# Patient Record
Sex: Female | Born: 1965 | Race: Black or African American | Hispanic: No | Marital: Single | Smoking: Never smoker
Health system: Southern US, Community
[De-identification: ages and names within clinical notes are randomized; demographics above are authoritative.]

## PROBLEM LIST (undated history)

## (undated) DIAGNOSIS — K449 Diaphragmatic hernia without obstruction or gangrene: Secondary | ICD-10-CM

## (undated) DIAGNOSIS — K219 Gastro-esophageal reflux disease without esophagitis: Secondary | ICD-10-CM

## (undated) HISTORY — PX: CHOLECYSTECTOMY: SHX55

## (undated) HISTORY — DX: Diaphragmatic hernia without obstruction or gangrene: K44.9

---

## 2004-08-20 ENCOUNTER — Emergency Department (HOSPITAL_COMMUNITY): Admission: EM | Admit: 2004-08-20 | Discharge: 2004-08-20 | Payer: Self-pay | Admitting: Emergency Medicine

## 2004-09-05 ENCOUNTER — Ambulatory Visit (HOSPITAL_COMMUNITY): Admission: RE | Admit: 2004-09-05 | Discharge: 2004-09-05 | Payer: Self-pay | Admitting: Family Medicine

## 2004-10-03 ENCOUNTER — Encounter (HOSPITAL_COMMUNITY): Admission: RE | Admit: 2004-10-03 | Discharge: 2004-11-02 | Payer: Self-pay | Admitting: Family Medicine

## 2007-08-05 ENCOUNTER — Ambulatory Visit (HOSPITAL_COMMUNITY): Admission: RE | Admit: 2007-08-05 | Discharge: 2007-08-05 | Payer: Self-pay | Admitting: Family Medicine

## 2007-08-21 ENCOUNTER — Encounter (INDEPENDENT_AMBULATORY_CARE_PROVIDER_SITE_OTHER): Payer: Self-pay | Admitting: General Surgery

## 2007-08-21 ENCOUNTER — Ambulatory Visit (HOSPITAL_COMMUNITY): Admission: RE | Admit: 2007-08-21 | Discharge: 2007-08-21 | Payer: Self-pay | Admitting: General Surgery

## 2007-09-25 ENCOUNTER — Emergency Department (HOSPITAL_COMMUNITY): Admission: EM | Admit: 2007-09-25 | Discharge: 2007-09-25 | Payer: Self-pay | Admitting: *Deleted

## 2008-03-16 ENCOUNTER — Emergency Department (HOSPITAL_COMMUNITY): Admission: EM | Admit: 2008-03-16 | Discharge: 2008-03-16 | Payer: Self-pay | Admitting: Emergency Medicine

## 2008-03-31 ENCOUNTER — Ambulatory Visit (HOSPITAL_COMMUNITY): Admission: RE | Admit: 2008-03-31 | Discharge: 2008-03-31 | Payer: Self-pay | Admitting: Internal Medicine

## 2008-10-17 ENCOUNTER — Emergency Department: Payer: Self-pay | Admitting: Emergency Medicine

## 2008-10-23 ENCOUNTER — Emergency Department (HOSPITAL_COMMUNITY): Admission: EM | Admit: 2008-10-23 | Discharge: 2008-10-23 | Payer: Self-pay | Admitting: Emergency Medicine

## 2008-10-26 ENCOUNTER — Ambulatory Visit (HOSPITAL_COMMUNITY): Admission: RE | Admit: 2008-10-26 | Discharge: 2008-10-26 | Payer: Self-pay | Admitting: Family Medicine

## 2009-01-23 ENCOUNTER — Emergency Department (HOSPITAL_COMMUNITY): Admission: EM | Admit: 2009-01-23 | Discharge: 2009-01-23 | Payer: Self-pay | Admitting: Emergency Medicine

## 2009-04-13 ENCOUNTER — Emergency Department (HOSPITAL_COMMUNITY): Admission: EM | Admit: 2009-04-13 | Discharge: 2009-04-13 | Payer: Self-pay | Admitting: Emergency Medicine

## 2009-04-16 ENCOUNTER — Ambulatory Visit (HOSPITAL_COMMUNITY): Admission: RE | Admit: 2009-04-16 | Discharge: 2009-04-16 | Payer: Self-pay | Admitting: Family Medicine

## 2009-04-26 ENCOUNTER — Ambulatory Visit (HOSPITAL_COMMUNITY): Admission: RE | Admit: 2009-04-26 | Discharge: 2009-04-26 | Payer: Self-pay | Admitting: Internal Medicine

## 2009-08-08 ENCOUNTER — Emergency Department (HOSPITAL_COMMUNITY): Admission: EM | Admit: 2009-08-08 | Discharge: 2009-08-08 | Payer: Self-pay | Admitting: Emergency Medicine

## 2009-11-25 ENCOUNTER — Ambulatory Visit (HOSPITAL_COMMUNITY): Admission: RE | Admit: 2009-11-25 | Discharge: 2009-11-25 | Payer: Self-pay | Admitting: Family Medicine

## 2010-03-05 IMAGING — CR DG CHEST 2V
1 series · 2 of 2 positions shown · non-contrast
Comparison: none

REASON FOR EXAM: Palpitations
COMMENTS:   LMP: One week ago

PROCEDURE:     DXR - DXR CHEST PA (OR AP) AND LATERAL  - October 17, 2008  [DATE]
RESULT:     The lungs are clear. The cardiac silhouette and visualized bony
skeleton are unremarkable.

[Series 1: view not recorded · 0.17mm/px · 2 of 2 slices shown]
[im 1/2]
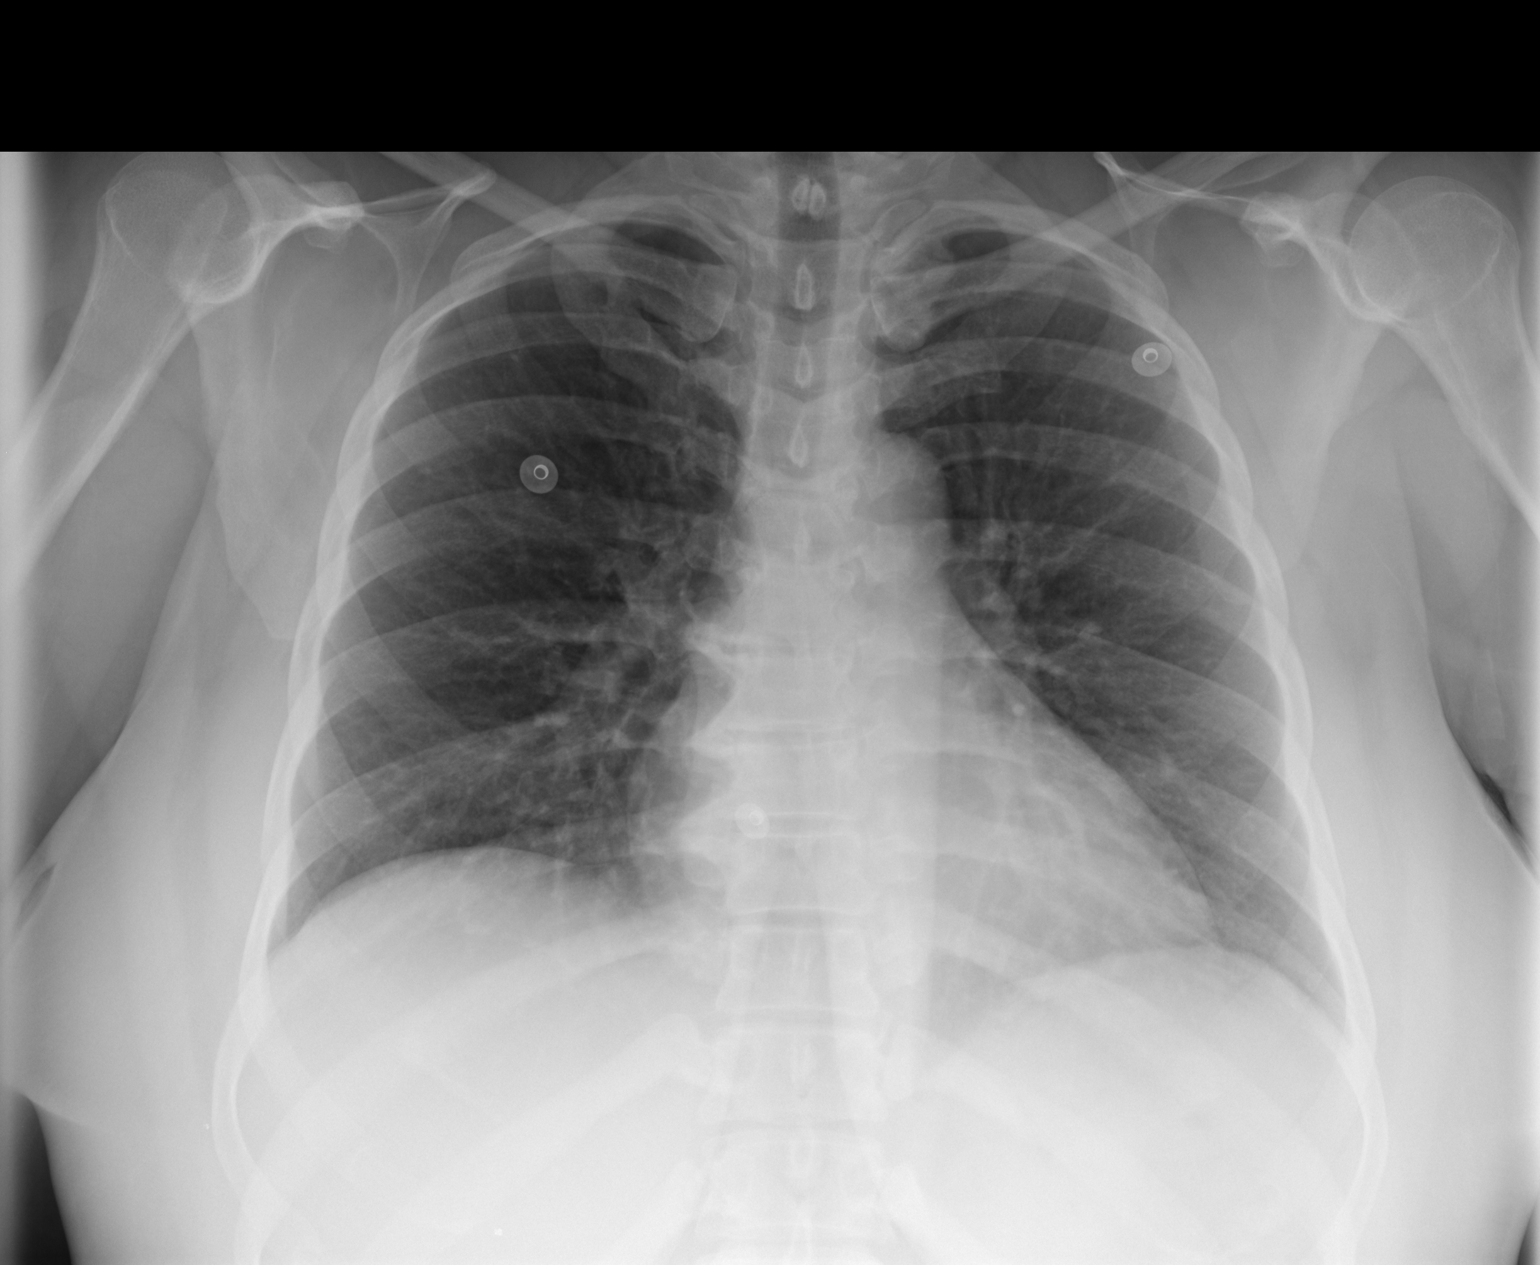
[im 2/2]
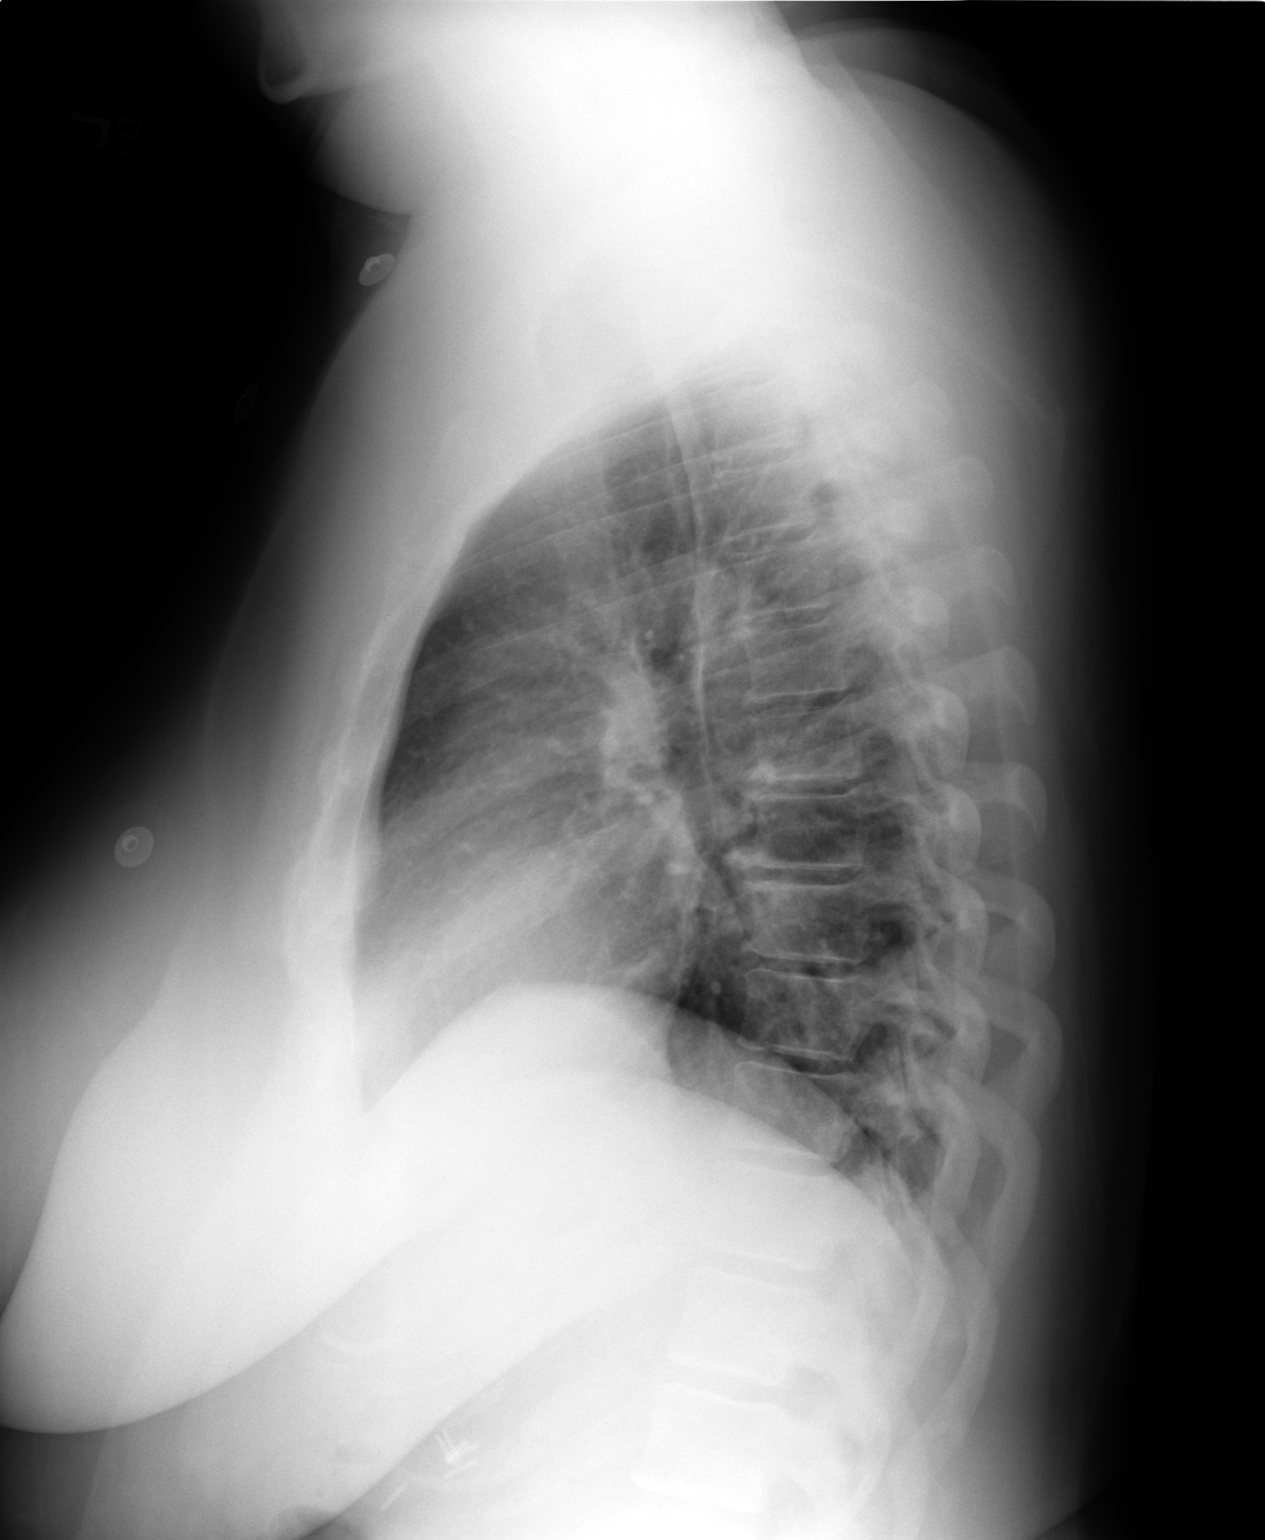

[2 of 2 positions shown; findings below may reference images not displayed]

IMPRESSION: Chest radiograph without evidence of acute cardiopulmonary
disease.

## 2010-06-11 IMAGING — CR DG CHEST 2V
2 series · 2 of 2 positions shown · non-contrast
Comparison: 10/23/2008

CLINICAL DATA: Chest pain

CHEST - 2 VIEW

[view not recorded (1 of 2)]
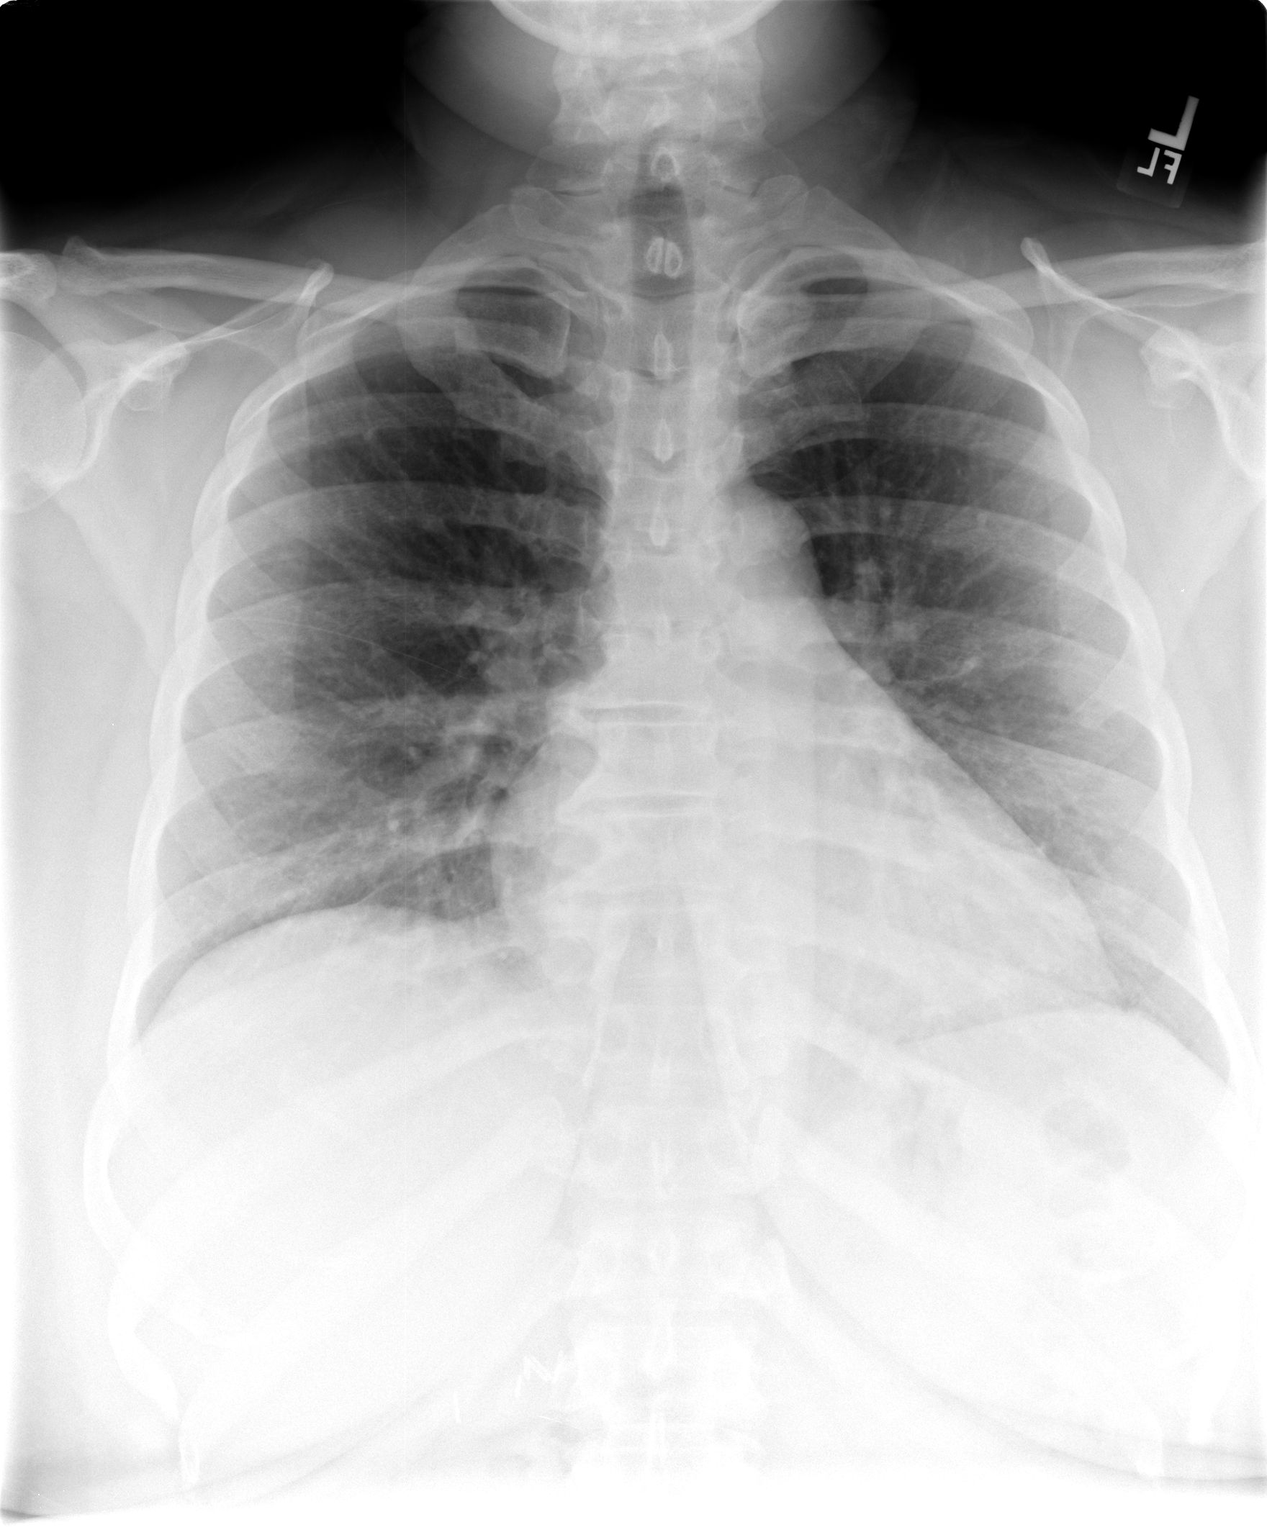

[view not recorded (2 of 2)]
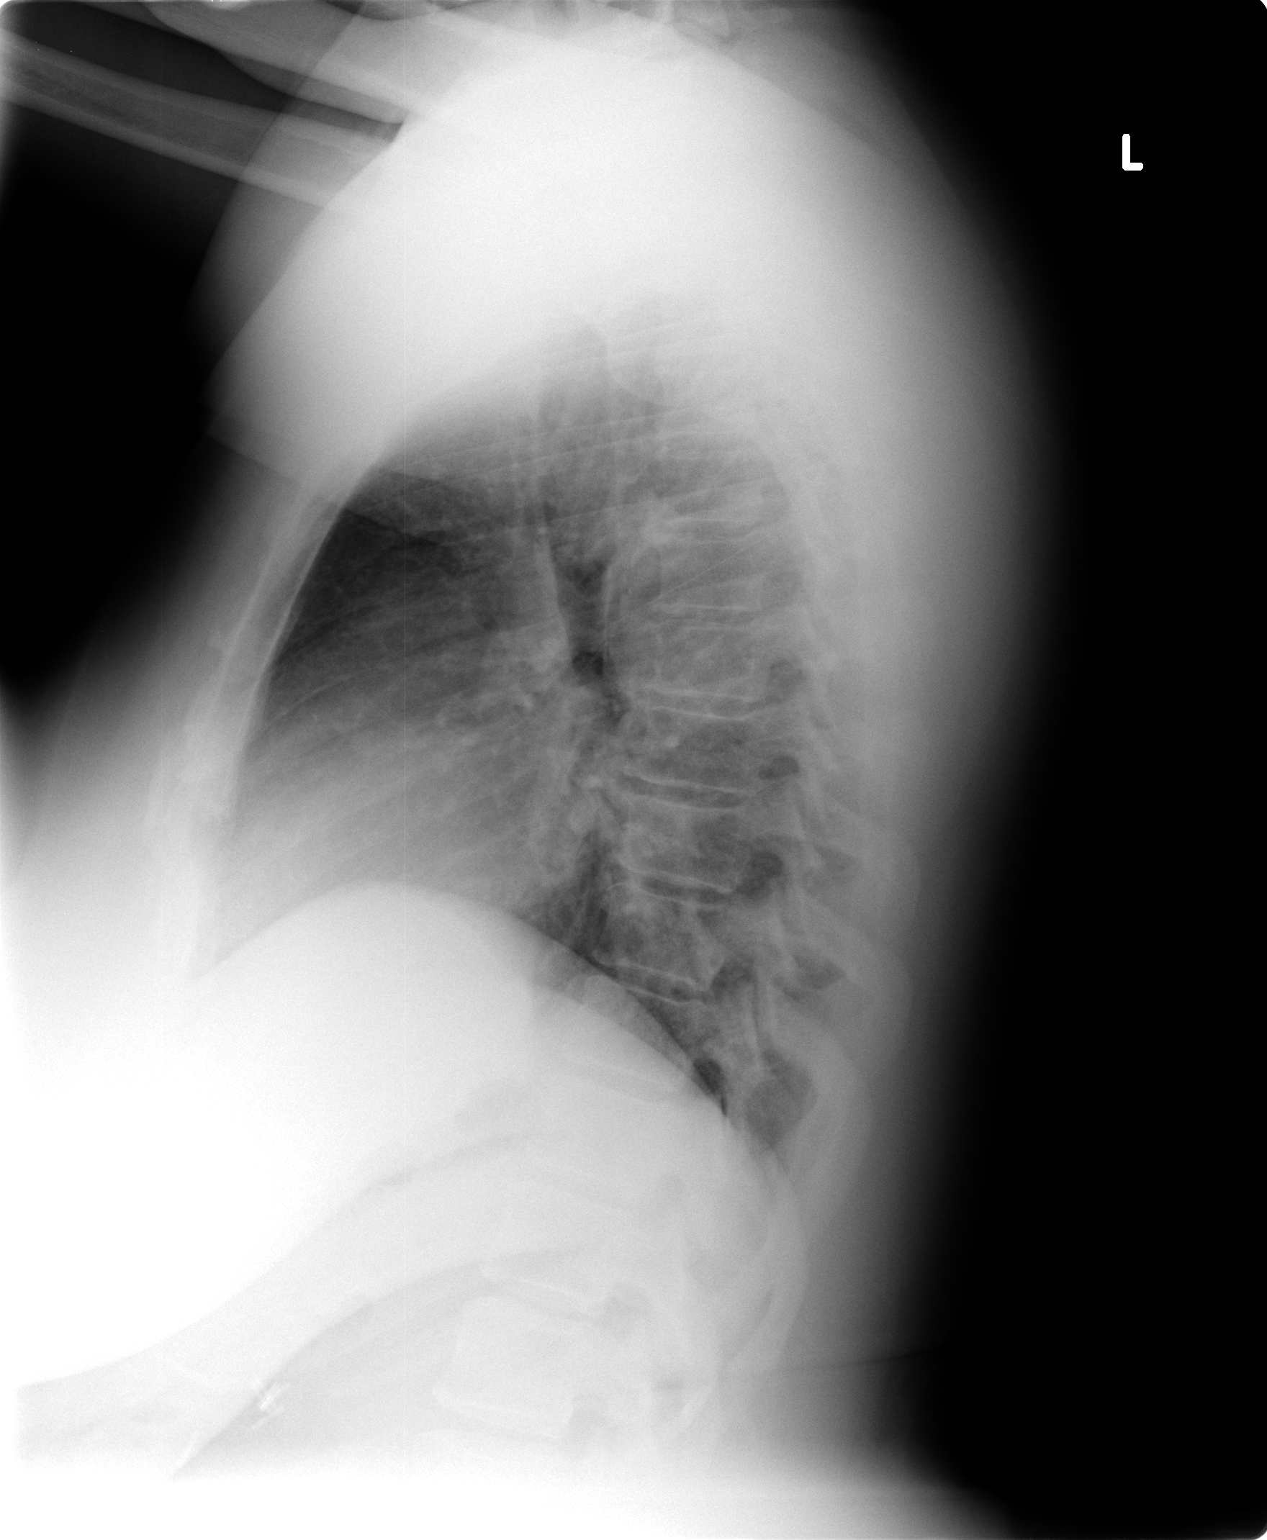

[2 of 2 positions shown; findings below may reference images not displayed]

FINDINGS: Mild cardiomegaly.  Chronic perihilar and bibasilar
interstitial prominence as before without evidence of superimposed
edema or confluent airspace infiltrate.  No effusion.  Vascular
clips in the right upper abdomen.  Spondylitic changes in the mid
thoracic spine.
IMPRESSION: 1.  Stable cardiomegaly and chronic interstitial changes without
acute or superimposed abnormality.

## 2010-11-28 ENCOUNTER — Ambulatory Visit (HOSPITAL_COMMUNITY)
Admission: RE | Admit: 2010-11-28 | Discharge: 2010-11-28 | Payer: Self-pay | Source: Home / Self Care | Attending: Internal Medicine | Admitting: Internal Medicine

## 2010-12-11 ENCOUNTER — Encounter: Payer: Self-pay | Admitting: Family Medicine

## 2011-02-24 LAB — URINALYSIS, ROUTINE W REFLEX MICROSCOPIC
Bilirubin Urine: NEGATIVE
Glucose, UA: NEGATIVE mg/dL
Hgb urine dipstick: NEGATIVE
Ketones, ur: NEGATIVE mg/dL
Nitrite: NEGATIVE
Protein, ur: NEGATIVE mg/dL
Specific Gravity, Urine: <1.005 — ABNORMAL LOW (ref 1.005–1.030)
Urobilinogen, UA: 0.2 mg/dL (ref 0.0–1.0)
pH: 5.5 (ref 5.0–8.0)

## 2011-02-24 LAB — PREGNANCY, URINE: Preg Test, Ur: NEGATIVE

## 2011-02-24 LAB — GLUCOSE, CAPILLARY: Glucose-Capillary: 106 mg/dL — ABNORMAL HIGH (ref 70–99)

## 2011-03-02 LAB — POCT CARDIAC MARKERS
CKMB, poc: <1 ng/mL — ABNORMAL LOW (ref 1.0–8.0)
Myoglobin, poc: 67.8 ng/mL (ref 12–200)
Troponin i, poc: <0.05 ng/mL (ref 0.00–0.09)

## 2011-03-02 LAB — CBC
HCT: 39.5 % (ref 36.0–46.0)
Hemoglobin: 13.2 g/dL (ref 12.0–15.0)
MCHC: 33.4 g/dL (ref 30.0–36.0)
MCV: 87.3 fL (ref 78.0–100.0)
Platelets: 322 10*3/uL (ref 150–400)
RBC: 4.53 MIL/uL (ref 3.87–5.11)
RDW: 12.9 % (ref 11.5–15.5)
WBC: 8.5 10*3/uL (ref 4.0–10.5)

## 2011-03-02 LAB — DIFFERENTIAL
Basophils Absolute: 0.1 10*3/uL (ref 0.0–0.1)
Basophils Relative: 1 % (ref 0–1)
Eosinophils Absolute: 0.1 10*3/uL (ref 0.0–0.7)
Eosinophils Relative: 1 % (ref 0–5)
Lymphocytes Relative: 19 % (ref 12–46)
Lymphs Abs: 1.6 10*3/uL (ref 0.7–4.0)
Monocytes Absolute: 0.7 10*3/uL (ref 0.1–1.0)
Monocytes Relative: 8 % (ref 3–12)
Neutro Abs: 6.1 10*3/uL (ref 1.7–7.7)
Neutrophils Relative %: 71 % (ref 43–77)

## 2011-03-02 LAB — COMPREHENSIVE METABOLIC PANEL
ALT: 22 U/L (ref 0–35)
AST: 22 U/L (ref 0–37)
Albumin: 3.4 g/dL — ABNORMAL LOW (ref 3.5–5.2)
Alkaline Phosphatase: 78 U/L (ref 39–117)
BUN: 9 mg/dL (ref 6–23)
CO2: 27 mEq/L (ref 19–32)
Calcium: 9 mg/dL (ref 8.4–10.5)
Chloride: 101 mEq/L (ref 96–112)
Creatinine, Ser: 0.86 mg/dL (ref 0.4–1.2)
GFR calc Af Amer: >60 mL/min (ref >60)
GFR calc non Af Amer: >60 mL/min (ref >60)
Glucose, Bld: 91 mg/dL (ref 70–99)
Potassium: 3.5 mEq/L (ref 3.5–5.1)
Sodium: 135 mEq/L (ref 135–145)
Total Bilirubin: 0.5 mg/dL (ref 0.3–1.2)
Total Protein: 7 g/dL (ref 6.0–8.3)

## 2011-04-04 NOTE — H&P (Signed)
NAMEFERNE, ELLINGWOOD             ACCOUNT NO.:  [id number]   MEDICAL RECORD NO.:  [id number]          PATIENT TYPE:  AMB   LOCATION:  DAY                           FACILITY:  [facility]   PHYSICIAN:  Dalia Heading, M.D.  DATE OF BIRTH:  1966-04-09   DATE OF ADMISSION:  08/21/2007  DATE OF DISCHARGE:  LH                              HISTORY & PHYSICAL   CHIEF COMPLAINT:  Cholecystitis, cholelithiasis.   HISTORY OF PRESENT ILLNESS:  The patient is a 45 year old black female  who is referred for evaluation and treatment of biliary colic secondary  to cholelithiasis.  She has been having right upper quadrant abdominal  pain, nausea, and bloating for many weeks.  She does have fatty food  intolerance.  No fever, chills, or jaundice have been noted.   PAST MEDICAL HISTORY:  Includes reflux disease.   PAST SURGICAL HISTORY:  Unremarkable.   CURRENT MEDICATIONS:  Hyoscyamine, omeprazole.   ALLERGIES:  AMOXICILLIN.   REVIEW OF SYSTEMS:  The patient denies drinking or smoking.  She denies  any other cardiopulmonary difficulties or bleeding disorders.   PHYSICAL EXAMINATION:  GENERAL:  The patient is a well-developed, well-  nourished black female in no acute distress.  HEENT:  Reveals no scleral icterus.  LUNGS:  Clear to auscultation with equal breath sounds bilaterally.  HEART:  Reveals a regular rate and rhythm without S3, S4, or murmurs.  ABDOMEN:  Soft and nondistended.  She is tender in the right upper  quadrant to palpation.  No hepatosplenomegaly, masses, or hernias are  identified.  Ultrasound of the gallbladder reveals cholelithiasis.   IMPRESSION:  Cholecystitis, cholelithiasis.   PLAN:  The patient is scheduled for laparoscopic cholecystectomy on  August 21, 2007.  The risks and benefits of the procedure including  bleeding, infection, hepatobiliary injury, the possibility of an open  procedure were fully explained to the patient, gave informed consent.      Dalia Heading, M.D.  Electronically Signed     MAJ/MEDQ  D:  08/15/2007  T:  08/16/2007  Job:  [id number]   cc:   Dalia Heading, M.D.  Fax: [phone]   Melony Overly, PA

## 2011-04-04 NOTE — Op Note (Signed)
NAMESHEILY, LINEMAN             ACCOUNT NO.:  [id number]   MEDICAL RECORD NO.:  [id number]          PATIENT TYPE:  AMB   LOCATION:  DAY                           FACILITY:  [facility]   PHYSICIAN:  Dalia Heading, M.D.  DATE OF BIRTH:  08-Jun-1966   DATE OF PROCEDURE:  08/21/2007  DATE OF DISCHARGE:                               OPERATIVE REPORT   PREOPERATIVE DIAGNOSES:  1. Cholecystitis.  2. Cholelithiasis.   POSTOPERATIVE DIAGNOSES:  1. Cholecystitis.  2. Cholelithiasis.  3. Choledocholithiasis.   PROCEDURE:  Laparoscopic cholecystectomy with intraoperative  cholangiograms.   SURGEON:  Dalia Heading, MD   ASSISTANT:  Tilford Pillar, MD   ANESTHESIA:  General endotracheal.   INDICATIONS:  The patient is a 45 year old black female who is referred  for evaluation and treatment biliary colic secondary to cholelithiasis.  The risks and benefits of the procedure including bleeding, infection,  hepatobiliary injury, and the possibility of an open procedure, were  fully explained to the patient, who gave informed consent.   PROCEDURE NOTE:  The patient was placed in the supine position.  After  induction of general endotracheal anesthesia, the abdomen was prepped  and draped using the usual sterile technique with Betadine.  Surgical  site confirmation was performed.   An infraumbilical incision was made down to the fascia.  A Veress needle  was introduced into the abdominal cavity and confirmation of placement  was done using the saline drop test.  The abdomen was then insufflated  to 16 mmHg pressure.  A 11-mm trocar was introduced into the abdominal  cavity under direct visualization without difficulty.  The patient was  placed in reversed Trendelenburg position.  An additional 11-mm trocar  was placed in the epigastric region and 5-mm trocars were placed in the  right upper quadrant and right flank regions.  The liver was inspected  and noted to normal limits.  The  gallbladder was retracted superior and  laterally.  The dissection was begun around the infundibulum of the  gallbladder.  The cystic duct was first identified.  Its juncture to the  infundibulum fully identified.  A single Endoclip was placed proximally  on the cystic duct.  An incision was made into the cystic duct and a  cholangiocatheter was inserted.  Under digital fluoroscopy,  cholangiograms were performed.  Two filling defects were noted in the  lower portion of the common bile duct.  The hepatobiliary tree was then  flushed aggressively with normal saline.  Repeat cholangiograms showed  the filling defects to be gone.  The dye flowed freely into the  duodenum.  I suspect that the patient did have two common bile duct  stones which were flushed into the GI tract without difficulty.  Multiple Endoclips were placed distally on the cystic duct and the  cystic duct was divided.  The cystic artery was likewise ligated and  divided.  The gallbladder was freed away from the gallbladder fossa  using Bovie electrocautery.  The gallbladder was delivered through the  epigastric trocar site using an EndoCatch bag.  The gallbladder fossa  was inspected and  no abnormal bleeding or bile leakage was noted.  Surgicel was placed in the gallbladder fossa.  All fluid and air were  then evacuated from the abdominal cavity prior to removal of the  trocars.   All wounds were irrigated with normal saline.  All wounds were injected  with 0.5% Sensorcaine.  The infraumbilical fascia was reapproximated  using a 0 Vicryl interrupted suture.  All skin incisions were closed  using staples.  Betadine ointment and dry sterile dressings were  applied.   All tape and needle counts were correct at the end of the procedure.  The patient was extubated in the operating room and went back to  recovery room awake in stable condition.   COMPLICATIONS:  None.   SPECIMEN:  Gallbladder.   BLOOD LOSS:   Minimal.      Dalia Heading, M.D.  Electronically Signed     MAJ/MEDQ  D:  08/21/2007  T:  08/21/2007  Job:  [id number]   cc:   [facility]

## 2011-08-25 LAB — URINE MICROSCOPIC-ADD ON

## 2011-08-25 LAB — PREGNANCY, URINE: Preg Test, Ur: NEGATIVE

## 2011-08-25 LAB — URINALYSIS, ROUTINE W REFLEX MICROSCOPIC
Ketones, ur: NEGATIVE mg/dL
Nitrite: NEGATIVE
Specific Gravity, Urine: 1.015 (ref 1.005–1.030)
Urobilinogen, UA: 0.2 mg/dL (ref 0.0–1.0)
pH: 6 (ref 5.0–8.0)

## 2011-08-25 LAB — POCT I-STAT, CHEM 8
Chloride: 105 mEq/L (ref 96–112)
Creatinine, Ser: 1 mg/dL (ref 0.4–1.2)
Hemoglobin: 13.9 g/dL (ref 12.0–15.0)
TCO2: 27 mmol/L (ref 0–100)

## 2011-08-29 LAB — COMPREHENSIVE METABOLIC PANEL
ALT: 23
AST: 27
CO2: 24
Calcium: 9.3
GFR calc Af Amer: >60
Potassium: 4
Sodium: 139
Total Protein: 7.7

## 2011-08-31 LAB — BASIC METABOLIC PANEL
BUN: 7
Chloride: 103
Glucose, Bld: 87
Potassium: 3.6

## 2011-08-31 LAB — CBC
HCT: 39.1
MCHC: 32.9
MCV: 84.5
Platelets: 379
RDW: 13.4

## 2011-08-31 LAB — HEPATIC FUNCTION PANEL
Bilirubin, Direct: 0.1
Total Bilirubin: 0.6

## 2011-10-24 ENCOUNTER — Other Ambulatory Visit (HOSPITAL_COMMUNITY): Payer: Self-pay | Admitting: Internal Medicine

## 2011-10-24 DIAGNOSIS — Z139 Encounter for screening, unspecified: Secondary | ICD-10-CM

## 2011-12-04 ENCOUNTER — Ambulatory Visit (HOSPITAL_COMMUNITY)
Admission: RE | Admit: 2011-12-04 | Discharge: 2011-12-04 | Disposition: A | Discharge status: Home / Self Care | Payer: Self-pay | Source: Ambulatory Visit | Attending: Internal Medicine | Admitting: Internal Medicine

## 2011-12-04 DIAGNOSIS — Z1231 Encounter for screening mammogram for malignant neoplasm of breast: Secondary | ICD-10-CM | POA: Insufficient documentation

## 2011-12-04 DIAGNOSIS — Z139 Encounter for screening, unspecified: Secondary | ICD-10-CM

## 2013-12-16 ENCOUNTER — Other Ambulatory Visit (HOSPITAL_COMMUNITY): Payer: Self-pay | Admitting: Internal Medicine

## 2013-12-16 DIAGNOSIS — Z139 Encounter for screening, unspecified: Secondary | ICD-10-CM

## 2013-12-22 ENCOUNTER — Ambulatory Visit (HOSPITAL_COMMUNITY)
Admission: RE | Admit: 2013-12-22 | Discharge: 2013-12-22 | Disposition: A | Discharge status: Home / Self Care | Payer: PRIVATE HEALTH INSURANCE | Source: Ambulatory Visit | Attending: Internal Medicine | Admitting: Internal Medicine

## 2013-12-22 DIAGNOSIS — Z139 Encounter for screening, unspecified: Secondary | ICD-10-CM

## 2013-12-22 DIAGNOSIS — Z1231 Encounter for screening mammogram for malignant neoplasm of breast: Secondary | ICD-10-CM | POA: Insufficient documentation

## 2014-12-29 ENCOUNTER — Other Ambulatory Visit (HOSPITAL_COMMUNITY): Payer: Self-pay | Admitting: Internal Medicine

## 2014-12-29 ENCOUNTER — Ambulatory Visit (HOSPITAL_COMMUNITY): Payer: PRIVATE HEALTH INSURANCE

## 2014-12-29 DIAGNOSIS — Z1231 Encounter for screening mammogram for malignant neoplasm of breast: Secondary | ICD-10-CM

## 2015-01-04 ENCOUNTER — Ambulatory Visit (HOSPITAL_COMMUNITY): Payer: PRIVATE HEALTH INSURANCE

## 2015-01-11 ENCOUNTER — Ambulatory Visit (HOSPITAL_COMMUNITY)
Admission: RE | Admit: 2015-01-11 | Discharge: 2015-01-11 | Disposition: A | Discharge status: Home / Self Care | Payer: PRIVATE HEALTH INSURANCE | Source: Ambulatory Visit | Attending: Internal Medicine | Admitting: Internal Medicine

## 2015-01-11 DIAGNOSIS — Z1231 Encounter for screening mammogram for malignant neoplasm of breast: Secondary | ICD-10-CM | POA: Diagnosis present

## 2015-12-13 ENCOUNTER — Other Ambulatory Visit (HOSPITAL_COMMUNITY): Payer: Self-pay | Admitting: Family Medicine

## 2015-12-13 DIAGNOSIS — Z1231 Encounter for screening mammogram for malignant neoplasm of breast: Secondary | ICD-10-CM

## 2016-01-17 ENCOUNTER — Ambulatory Visit (HOSPITAL_COMMUNITY)
Admission: RE | Admit: 2016-01-17 | Discharge: 2016-01-17 | Disposition: A | Discharge status: Home / Self Care | Payer: Managed Care, Other (non HMO) | Source: Ambulatory Visit | Attending: Family Medicine | Admitting: Family Medicine

## 2016-01-17 DIAGNOSIS — Z1231 Encounter for screening mammogram for malignant neoplasm of breast: Secondary | ICD-10-CM | POA: Insufficient documentation

## 2016-05-10 ENCOUNTER — Encounter: Payer: Self-pay | Admitting: *Deleted

## 2016-05-11 ENCOUNTER — Ambulatory Visit (INDEPENDENT_AMBULATORY_CARE_PROVIDER_SITE_OTHER): Payer: PRIVATE HEALTH INSURANCE | Admitting: Obstetrics and Gynecology

## 2016-05-11 ENCOUNTER — Other Ambulatory Visit: Payer: Self-pay | Admitting: Obstetrics and Gynecology

## 2016-05-11 ENCOUNTER — Encounter: Payer: Self-pay | Admitting: Obstetrics and Gynecology

## 2016-05-11 VITALS — BP 150/92 | Ht 60.5 in | Wt 244.0 lb

## 2016-05-11 DIAGNOSIS — N841 Polyp of cervix uteri: Secondary | ICD-10-CM | POA: Diagnosis not present

## 2016-05-11 NOTE — Progress Notes (Signed)
Patient ID: Samantha Walsh, female   DOB: 09-13-1966, 50 y.o.   MRN: [id number]    [place] [facility] Visit  @DATE @            Patient name: Samantha Walsh MRN [id number]  Date of birth: 10-13-1966  CC & HPI:  Samantha Walsh is a 50 y.o. female presenting today for cervical tag removal. Pt was referred by her PCP at [facility]. Per [facility] records, tag protruding from cervix has increased in size from last year and pt was referred here for f/u d/t h/o benign cervical polyp I. Pt states her periods are regular on oral contraceptives. She denies vaginal bleeding or abdominal pain.   ROS:  Review of Systems  Gastrointestinal: Negative for abdominal pain.  Genitourinary:       -vaginal bleeding  All other systems reviewed and are negative.    Pertinent History Reviewed:   Reviewed: Significant for benign cervical polyp I  Medical         Past Medical History  Diagnosis Date  . Hiatal hernia                               Surgical Hx:    Past Surgical History  Procedure Laterality Date  . Cholecystectomy     Medications: Reviewed & Updated - see associated section                       Current outpatient prescriptions:  .  cetirizine (ZYRTEC) 10 MG tablet, Take 10 mg by mouth daily., Disp: , Rfl:  .  hyoscyamine (ANASPAZ) 0.125 MG TBDP disintergrating tablet, Place 0.125 mg under the tongue 4 (four) times daily as needed., Disp: , Rfl:  .  norethindrone (MICRONOR,CAMILA,ERRIN) 0.35 MG tablet, Take 1 tablet by mouth daily., Disp: , Rfl:  .  omeprazole (PRILOSEC OTC) 20 MG tablet, Take 20 mg by mouth daily., Disp: , Rfl:    Social History: Reviewed -  reports that she has never smoked. She has never used smokeless tobacco.  Objective Findings:  Vitals: Blood pressure 150/92, height 5' 0.5" (1.537 m), weight 244 lb (110.678 kg).  Physical Examination: General appearance - alert, well appearing, and in no distress Abdomen - soft, nontender, nondistended, no masses or  organomegaly Pelvic - normal external genitalia, vulva, vagina, cervix, uterus and adnexa,  VULVA: normal appearing vulva with no masses, tenderness or lesions,  VAGINA: normal appearing vagina with normal color and discharge, no lesions,  CERVIX: normal appearing cervix without discharge or lesions,except for polyp flat on surface, 1 cm wide x 5 mm thick UTERUS: small, non-tender  Musculoskeletal - no joint tenderness, deformity or swelling Extremities - peripheral pulses normal, no pedal edema, no clubbing or cyanosis Skin - normal coloration and turgor, no rashes, no suspicious skin lesions noted  Removal of Cervical Polyp Procedure Note  The patient's identification was confirmed and consent was obtained. This procedure was performed by Jonnie Kind at 4:25 PM Site & Appearance: cervix  Sterile procedures observed Anesthetic used: none Monsel's applied  1 cmCervical  tag excised grasped with uterine packing forcepsand sample sent to pathology.  Patient tolerated procedure well without complications. Minimal blood loss. Instructions for care discussed verbally and patient provided with menstrual pad, treated with monsels. additional written instructions for homecare and f/u.    Assessment & Plan:   A:  1. Encounter for evaluation and  removal of cervical polyp   P:  1. F/u PRN 2. Send results to Dara Hoyer PA      By signing my name below, I, Hansel Feinstein, attest that this documentation has been prepared under the direction and in the presence of Jonnie Kind, MD. Electronically Signed: Hansel Feinstein, ED Scribe. 05/11/2016. 4:18 PM.  . I personally performed the services described in this documentation, which was SCRIBED in my presence. The recorded information has been reviewed and considered accurate. It has been edited as necessary during review. Jonnie Kind, MD

## 2016-05-11 NOTE — Addendum Note (Signed)
Addended by: Farley Ly on: 05/11/2016 05:12 PM   Modules accepted: Orders

## 2016-05-16 ENCOUNTER — Encounter: Payer: Self-pay | Admitting: Adult Health

## 2016-05-16 ENCOUNTER — Telehealth: Payer: Self-pay | Admitting: Adult Health

## 2016-05-16 NOTE — Telephone Encounter (Signed)
Spoke with pt. Pt was calling for her polyp results. We have no results so I called [facility] and they stated they just got the biopsy last pm and it should be completed today or tomorrow. I told pt to call back on Thursday if she hasn't heard from [place]. Pt voiced understanding. [place]

## 2016-05-24 ENCOUNTER — Telehealth: Payer: Self-pay | Admitting: *Deleted

## 2016-05-24 NOTE — Telephone Encounter (Signed)
Pt aware of results of polyp.

## 2016-12-20 ENCOUNTER — Other Ambulatory Visit (HOSPITAL_COMMUNITY): Payer: Self-pay | Admitting: Family Medicine

## 2016-12-20 DIAGNOSIS — Z1231 Encounter for screening mammogram for malignant neoplasm of breast: Secondary | ICD-10-CM

## 2017-01-22 ENCOUNTER — Ambulatory Visit (HOSPITAL_COMMUNITY)
Admission: RE | Admit: 2017-01-22 | Discharge: 2017-01-22 | Disposition: A | Discharge status: Home / Self Care | Payer: PRIVATE HEALTH INSURANCE | Source: Ambulatory Visit | Attending: Family Medicine | Admitting: Family Medicine

## 2017-01-22 DIAGNOSIS — Z1231 Encounter for screening mammogram for malignant neoplasm of breast: Secondary | ICD-10-CM | POA: Diagnosis not present

## 2017-05-24 ENCOUNTER — Telehealth: Payer: Self-pay

## 2017-05-24 NOTE — Telephone Encounter (Signed)
[phone] PATIENT RECEIVED LETTER TO SCHEDULE TCS

## 2017-05-30 ENCOUNTER — Telehealth: Payer: Self-pay

## 2017-05-30 NOTE — Telephone Encounter (Signed)
Tried to call. Could not leave a VM.

## 2017-05-30 NOTE — Telephone Encounter (Signed)
See separate triage.  

## 2017-06-13 NOTE — Telephone Encounter (Signed)
Waiting on call back to see if pt has every had any complications with anesthesia.

## 2017-06-13 NOTE — Telephone Encounter (Signed)
Waiting on call to see if pt has every had any complications with anesthesia to complete the traige.

## 2017-06-21 NOTE — Telephone Encounter (Signed)
Gastroenterology Pre-Procedure Review  Request Date:05/30/2017 Requesting Physician: Delman Cheadle, PA  PATIENT REVIEW QUESTIONS: The patient responded to the following health history questions as indicated:    1. Diabetes Melitis: no 2. Joint replacements in the past 12 months: no 3. Major health problems in the past 3 months: no 4. Has an artificial valve or MVP: no 5. Has a defibrillator: no 6. Has been advised in past to take antibiotics in advance of a procedure like teeth cleaning: no 7. Family history of colon cancer: no  8. Alcohol Use: no 9. History of sleep apnea: no  10. History of coronary artery or other vascular stents placed within the last 12 months: no 11. History of any prior anesthesia complications: no    MEDICATIONS & ALLERGIES:    Patient reports the following regarding taking any blood thinners:   Plavix? no Aspirin? no Coumadin? no Brilinta? no Xarelto? no Eliquis? no Pradaxa? no Savaysa? no Effient? no  Patient confirms/reports the following medications:  Current Outpatient Prescriptions  Medication Sig Dispense Refill  . cetirizine (ZYRTEC) 10 MG tablet Take 10 mg by mouth daily. Takes only as needed    . hyoscyamine (ANASPAZ) 0.125 MG TBDP disintergrating tablet Place 0.125 mg under the tongue 4 (four) times daily as needed.    . norethindrone (MICRONOR,CAMILA,ERRIN) 0.35 MG tablet Take 1 tablet by mouth daily.    Marland Kitchen omeprazole (PRILOSEC OTC) 20 MG tablet Take 20 mg by mouth daily.     No current facility-administered medications for this visit.     Patient confirms/reports the following allergies:  Allergies  Allergen Reactions  . Amoxicillin   . Clindamycin/Lincomycin     oral    No orders of the defined types were placed in this encounter.   AUTHORIZATION INFORMATION Primary Insurance:   ID #:   Group #:  Pre-Cert / Auth required:  Pre-Cert / Auth #:   Secondary Insurance:  ID #:   Group #:  Pre-Cert / Auth required:  Pre-Cert  / Auth #:   SCHEDULE INFORMATION: Procedure has been scheduled as follows:  Date:           Time:   Location:   This Gastroenterology Pre-Precedure Review Form is being routed to the following provider(s): Barney Drain, MD

## 2017-06-21 NOTE — Telephone Encounter (Signed)
Ok to schedule.

## 2017-06-26 ENCOUNTER — Other Ambulatory Visit: Payer: Self-pay

## 2017-06-26 DIAGNOSIS — Z1211 Encounter for screening for malignant neoplasm of colon: Secondary | ICD-10-CM

## 2017-06-26 MED ORDER — PEG 3350-KCL-NA BICARB-NACL 420 G PO SOLR: 4000.0000 mL | ORAL | 0 refills | Status: DC

## 2017-06-26 NOTE — Telephone Encounter (Signed)
Pt has been scheduled for colonoscopy on 08/13/2017 at 10:30 Am with Dr. Oneida Alar.

## 2017-06-26 NOTE — Telephone Encounter (Signed)
Rx sent to the pharmacy and instructions mailed to pt.  

## 2017-06-26 NOTE — Telephone Encounter (Signed)
LMOM for a return call for date and time for procedure.

## 2017-06-27 NOTE — Telephone Encounter (Signed)
NO PA is needed for TCS 

## 2017-07-02 NOTE — Telephone Encounter (Signed)
Pt called to make sure we are aware that she has a hiatal hernia. She is scheduled for her colonoscopy on 08/13/2017.  I told her I would let the staff know but that should not interfere with her procedure.

## 2017-07-04 NOTE — Telephone Encounter (Signed)
Agree, hiatal hernia should not be an issue for colonoscopy. Thanks.

## 2017-07-05 NOTE — Telephone Encounter (Signed)
Left the message on VM.

## 2017-08-06 ENCOUNTER — Telehealth: Payer: Self-pay | Admitting: Gastroenterology

## 2017-08-06 NOTE — Telephone Encounter (Signed)
Pt said her insurance co said that we need to bill her colonoscopy as preventative care. I explained to her that we list it as screening which is same as preventative. She said they want [place] to call and speak with them. I told her that I would give her the code and let her call them and I gave her the [place] code [place] 40347. She said she will call the insurance co.

## 2017-08-06 NOTE — Telephone Encounter (Signed)
Patient called to speak with DS. I told her that DS was at lunch and I would have to take a message. She has questions about her colonoscopy with SF on 9/24 and would like DS to call her at [phone]

## 2017-08-06 NOTE — Telephone Encounter (Signed)
Reviewed

## 2017-08-13 ENCOUNTER — Encounter (HOSPITAL_COMMUNITY): Payer: Self-pay

## 2017-08-13 ENCOUNTER — Ambulatory Visit (HOSPITAL_COMMUNITY)
Admission: RE | Admit: 2017-08-13 | Discharge: 2017-08-13 | Disposition: A | Discharge status: Home / Self Care | Payer: PRIVATE HEALTH INSURANCE | Source: Ambulatory Visit | Attending: Gastroenterology | Admitting: Gastroenterology

## 2017-08-13 ENCOUNTER — Encounter (HOSPITAL_COMMUNITY)
Admission: RE | Disposition: A | Discharge status: Home / Self Care | Payer: Self-pay | Source: Ambulatory Visit | Attending: Gastroenterology

## 2017-08-13 DIAGNOSIS — D125 Benign neoplasm of sigmoid colon: Secondary | ICD-10-CM | POA: Diagnosis not present

## 2017-08-13 DIAGNOSIS — K621 Rectal polyp: Secondary | ICD-10-CM | POA: Diagnosis not present

## 2017-08-13 DIAGNOSIS — K644 Residual hemorrhoidal skin tags: Secondary | ICD-10-CM | POA: Diagnosis not present

## 2017-08-13 DIAGNOSIS — Z881 Allergy status to other antibiotic agents status: Secondary | ICD-10-CM | POA: Diagnosis not present

## 2017-08-13 DIAGNOSIS — Z1212 Encounter for screening for malignant neoplasm of rectum: Secondary | ICD-10-CM | POA: Diagnosis not present

## 2017-08-13 DIAGNOSIS — Z79899 Other long term (current) drug therapy: Secondary | ICD-10-CM | POA: Insufficient documentation

## 2017-08-13 DIAGNOSIS — K449 Diaphragmatic hernia without obstruction or gangrene: Secondary | ICD-10-CM | POA: Diagnosis not present

## 2017-08-13 DIAGNOSIS — K635 Polyp of colon: Secondary | ICD-10-CM

## 2017-08-13 DIAGNOSIS — K648 Other hemorrhoids: Secondary | ICD-10-CM | POA: Insufficient documentation

## 2017-08-13 DIAGNOSIS — D126 Benign neoplasm of colon, unspecified: Secondary | ICD-10-CM | POA: Diagnosis not present

## 2017-08-13 DIAGNOSIS — Z1211 Encounter for screening for malignant neoplasm of colon: Secondary | ICD-10-CM | POA: Diagnosis present

## 2017-08-13 DIAGNOSIS — K219 Gastro-esophageal reflux disease without esophagitis: Secondary | ICD-10-CM | POA: Diagnosis not present

## 2017-08-13 DIAGNOSIS — Z8249 Family history of ischemic heart disease and other diseases of the circulatory system: Secondary | ICD-10-CM | POA: Insufficient documentation

## 2017-08-13 DIAGNOSIS — Z880 Allergy status to penicillin: Secondary | ICD-10-CM | POA: Diagnosis not present

## 2017-08-13 DIAGNOSIS — Z833 Family history of diabetes mellitus: Secondary | ICD-10-CM | POA: Insufficient documentation

## 2017-08-13 DIAGNOSIS — Q438 Other specified congenital malformations of intestine: Secondary | ICD-10-CM | POA: Diagnosis not present

## 2017-08-13 DIAGNOSIS — Z9049 Acquired absence of other specified parts of digestive tract: Secondary | ICD-10-CM | POA: Insufficient documentation

## 2017-08-13 HISTORY — DX: Gastro-esophageal reflux disease without esophagitis: K21.9

## 2017-08-13 HISTORY — PX: COLONOSCOPY: SHX5424

## 2017-08-13 HISTORY — PX: POLYPECTOMY: SHX5525

## 2017-08-13 SURGERY — COLONOSCOPY
Anesthesia: Moderate Sedation

## 2017-08-13 MED ORDER — MIDAZOLAM HCL 5 MG/5ML IJ SOLN
INTRAMUSCULAR | Status: AC
  Filled 2017-08-13: qty 10

## 2017-08-13 MED ORDER — MEPERIDINE HCL 100 MG/ML IJ SOLN
INTRAMUSCULAR | Status: DC | PRN
  Administered 2017-08-13 (×3): 25 mg via INTRAVENOUS

## 2017-08-13 MED ORDER — STERILE WATER FOR IRRIGATION IR SOLN
Status: DC | PRN
  Administered 2017-08-13: 10:00:00

## 2017-08-13 MED ORDER — SODIUM CHLORIDE 0.9 % IV SOLN
INTRAVENOUS | Status: DC
  Administered 2017-08-13: 09:00:00 via INTRAVENOUS

## 2017-08-13 MED ORDER — MIDAZOLAM HCL 5 MG/5ML IJ SOLN
INTRAMUSCULAR | Status: DC | PRN
  Administered 2017-08-13 (×4): 2 mg via INTRAVENOUS

## 2017-08-13 MED ORDER — MEPERIDINE HCL 100 MG/ML IJ SOLN
INTRAMUSCULAR | Status: AC
  Filled 2017-08-13: qty 2

## 2017-08-13 NOTE — H&P (Signed)
  Primary Care Physician:  Scherrie Bateman Primary Gastroenterologist:  Dr. Oneida Alar  Pre-Procedure History & Physical: HPI:  Samantha Walsh is a 51 y.o. female here for [place].  Past Medical History:  Diagnosis Date  . GERD (gastroesophageal reflux disease)   . Hiatal hernia     Past Surgical History:  Procedure Laterality Date  . CHOLECYSTECTOMY      Prior to Admission medications   Medication Sig Start Date End Date Taking? Authorizing Provider  cetirizine (ZYRTEC) 10 MG tablet Take 10 mg by mouth daily as needed for allergies. Takes only as needed   Yes [provider]  hyoscyamine (ANASPAZ) 0.125 MG TBDP disintergrating tablet Place 0.125 mg under the tongue 4 (four) times daily as needed for cramping.    Yes [provider]  ibuprofen (ADVIL,MOTRIN) 200 MG tablet Take 400 mg by mouth every 8 (eight) hours as needed for mild pain or moderate pain.   Yes [provider]  norethindrone (MICRONOR,CAMILA,ERRIN) 0.35 MG tablet Take 1 tablet by mouth daily.   Yes [provider]  omeprazole (PRILOSEC OTC) 20 MG tablet Take 20 mg by mouth daily.   Yes [provider]    Allergies as of 06/26/2017 - Review Complete 05/30/2017  Allergen Reaction Noted  . Amoxicillin  05/10/2016  . Clindamycin/lincomycin  05/10/2016    Family History  Problem Relation Age of Onset  . Diabetes Mother   . Hypertension Mother   . Colon cancer Neg Hx   . Colon polyps Neg Hx     Social History   Social History  . Marital status: Single    Spouse name: N/A  . Number of children: N/A  . Years of education: N/A   Occupational History  . Not on file.   Social History Main Topics  . Smoking status: Never Smoker  . Smokeless tobacco: Never Used  . Alcohol use No  . Drug use: No  . Sexual activity: Not Currently    Birth control/ protection: Pill   Other Topics Concern  . Not on file   Social History Narrative  . No  narrative on file    Review of Systems: See HPI, otherwise negative ROS   Physical Exam: BP (!) 160/91   Pulse 74   Temp 99.4 F (37.4 C) (Oral)   Resp 16   Ht 5' (1.524 m)   Wt 244 lb (110.7 kg)   SpO2 98%   BMI 47.65 kg/m  General:   Alert,  pleasant and cooperative in NAD Head:  Normocephalic and atraumatic. Neck:  Supple; Lungs:  Clear throughout to auscultation.    Heart:  Regular rate and rhythm. Abdomen:  Soft, nontender and nondistended. Normal bowel sounds, without guarding, and without rebound.   Neurologic:  Alert and  oriented x4;  grossly normal neurologically.  Impression/Plan:     SCREENING  Plan:  1. TCS TODAY DISCUSSED PROCEDURE, BENEFITS, & RISKS: < 1% chance of medication reaction, bleeding, perforation, or rupture of spleen/liver.

## 2017-08-13 NOTE — Op Note (Signed)
[facility] Patient Name: Samantha Walsh Procedure Date: 08/13/2017 10:04 AM MRN: [id number] Date of Birth: 1966/03/08 Attending MD: Barney Drain MD, MD CSN: [id number] Age: 51 Admit Type: Outpatient Procedure:                Colonoscopy WITH COLD FORCEPS POLYPECTOMY Indications:              Screening for colorectal malignant neoplasm Providers:                Barney Drain MD, MD, Otis Peak B. Sharon Seller, RN, Rosina Lowenstein, RN Referring MD:              Medicines:                Meperidine 75 mg IV, Midazolam 8 mg IV Complications:            No immediate complications. Estimated Blood Loss:     Estimated blood loss was minimal. Procedure:                Pre-Anesthesia Assessment:                           - Prior to the procedure, a History and Physical                            was performed, and patient medications and                            allergies were reviewed. The patient's tolerance of                            previous anesthesia was also reviewed. The risks                            and benefits of the procedure and the sedation                            options and risks were discussed with the patient.                            All questions were answered, and informed consent                            was obtained. Prior Anticoagulants: The patient has                            taken ibuprofen, last dose was more than 4 weeks                            prior to procedure. ASA Grade Assessment: I - A                            normal, healthy patient. After reviewing the risks  and benefits, the patient was deemed in                            satisfactory condition to undergo the procedure.                            After obtaining informed consent, the colonoscope                            was passed under direct vision. Throughout the                            procedure, the patient's blood pressure,  pulse, and                            oxygen saturations were monitored continuously. The                            EC-[id number] ([id number]) scope was introduced through                            the anus and advanced to the the cecum, identified                            by appendiceal orifice and ileocecal valve. The                            colonoscopy was somewhat difficult due to a                            tortuous colon and the patient's agitation.                            Successful completion of the procedure was aided by                            increasing the dose of sedation medication and                            COLOWRAP. The patient tolerated the procedure                            fairly well. The quality of the bowel preparation                            was good. The ileocecal valve, appendiceal orifice,                            and rectum were photographed. Scope In: 10:38:25 AM Scope Out: 11:00:29 AM Scope Withdrawal Time: 0 hours 17 minutes 53 seconds  Total Procedure Duration: 0 hours 22 minutes 4 seconds  Findings:      Two sessile polyps were found in the rectum and sigmoid colon. The       polyps  were 3 to 5 mm in size. These polyps were removed with a cold       biopsy forceps. Resection and retrieval were complete.      The recto-sigmoid colon and sigmoid colon were moderately redundant.      External and internal hemorrhoids were found during retroflexion. The       hemorrhoids were small. Impression:               - Two 3 to 5 mm polyps in the rectum and in the                            sigmoid colon, removed with a cold biopsy forceps.                            Resected and retrieved.                           - Redundant colon.                           - External and internal hemorrhoids. Moderate Sedation:      Moderate (conscious) sedation was administered by the endoscopy nurse       and supervised by the endoscopist. The following  parameters were       monitored: oxygen saturation, heart rate, blood pressure, and response       to care. Total physician intraservice time was 33 minutes. Recommendation:           - Repeat colonoscopy in 5-10 years for surveillance.                           - High fiber diet. CONTINUE WEIGHT LOSS EFFORTS.                           - Continue present medications.                           - Await pathology results.                           - Patient has a contact number available for                            emergencies. The signs and symptoms of potential                            delayed complications were discussed with the                            patient. Return to normal activities tomorrow.                            Written discharge instructions were provided to the                            patient. Procedure Code(s):        --- Professional ---  29798, Colonoscopy, flexible; with biopsy, single                            or multiple                           99152, Moderate sedation services provided by the                            same physician or other qualified health care                            professional performing the diagnostic or                            therapeutic service that the sedation supports,                            requiring the presence of an independent trained                            observer to assist in the monitoring of the                            patient's level of consciousness and physiological                            status; initial 15 minutes of intraservice time,                            patient age 16 years or older                           [phone], Moderate sedation services; each additional                            15 minutes intraservice time Diagnosis Code(s):        --- Professional ---                           Z12.11, Encounter for screening for malignant                             neoplasm of colon                           K62.1, Rectal polyp                           D12.5, Benign neoplasm of sigmoid colon                           K64.8, Other hemorrhoids                           Q43.8, Other specified congenital malformations of  intestine CPT copyright 2016 [employer]. All rights reserved. The codes documented in this report are preliminary and upon coder review may  be revised to meet current compliance requirements. Barney Drain, MD Barney Drain MD, MD 08/13/2017 11:15:41 AM This report has been signed electronically. Number of Addenda: 0

## 2017-08-13 NOTE — Discharge Instructions (Signed)
You have small internal AND EXTERNAL HEMORRHOIDS. YOU HAD Two POLYPS REMOVED.    DRINK WATER TO KEEP YOUR URINE LIGHT YELLOW.  FOLLOW A HIGH FIBER DIET. AVOID ITEMS THAT CAUSE BLOATING. See info below.YOU SHOULD TRANSITION TO A PLANT BASED DIET-NO MEAT OR DAIRY for 6 MOS. I RECOMMEND THE BOOK, "PREVENT AND REVERSE HEART DISEASE", CALDWELL ESSELSTYN JR., MD. PAGES 120-121 Brandt THE DIET AND THE LAST HALF OF THE BOOK HAS QUICK AND EASY RECIPES FOR BREAKFAST, LUNCH, AND DINNER ARE AFTER PAGE 127.   CONTINUE YOUR WEIGHT LOSS EFFORTS.  WHILE I DO NOT WANT TO ALARM YOU, YOUR BODY MASS INDEX (BMI) IS OVER 40 WHICH MEANS YOU ARE MORBIDLY OBESE. OBESITY ACTIVATES CANCER GENES. MORBID OBESITY SHORTENS YOUR LIFE EXPECTANCY 10 YEARS. OBESITY IS ASSOCIATED WITH AN INCREASE RISK FOR ALL CANCERS, INCLUDING ESOPHAGEAL AND COLON CANCER. A WEIGHT OF 155 lbs WILL GET YOUR BMI UNDER 30.  YOUR BIOPSY RESULTS WILL BE AVAILABLE IN MY CHART AFTER  Sep 27 AND MY OFFICE WILL CONTACT YOU IN 10-14 DAYS WITH YOUR RESULTS.   Next colonoscopy in 5-10 years.  Colonoscopy Care After Read the instructions outlined below and refer to this sheet in the next week. These discharge instructions provide you with general information on caring for yourself after you leave the hospital. While your treatment has been planned according to the most current medical practices available, unavoidable complications occasionally occur. If you have any problems or questions after discharge, call DR. Lousie Calico, [phone].  ACTIVITY  You may resume your regular activity, but move at a slower pace for the next 24 hours.   Take frequent rest periods for the next 24 hours.   Walking will help get rid of the air and reduce the bloated feeling in your belly (abdomen).   No driving for 24 hours (because of the medicine (anesthesia) used during the test).   You may shower.   Do not sign any important legal documents or  operate any machinery for 24 hours (because of the anesthesia used during the test).    NUTRITION  Drink plenty of fluids.   You may resume your normal diet as instructed by your doctor.   Begin with a light meal and progress to your normal diet. Heavy or fried foods are harder to digest and may make you feel sick to your stomach (nauseated).   Avoid alcoholic beverages for 24 hours or as instructed.    MEDICATIONS  You may resume your normal medications.   WHAT YOU CAN EXPECT TODAY  Some feelings of bloating in the abdomen.   Passage of more gas than usual.   Spotting of blood in your stool or on the toilet paper  .  IF YOU HAD POLYPS REMOVED DURING THE COLONOSCOPY:  Eat a soft diet IF YOU HAVE NAUSEA, BLOATING, ABDOMINAL PAIN, OR VOMITING.    FINDING OUT THE RESULTS OF YOUR TEST Not all test results are available during your visit. DR. Oneida Alar WILL CALL YOU WITHIN 14 DAYS OF YOUR PROCEDUE WITH YOUR RESULTS. Do not assume everything is normal if you have not heard from DR. Jazyah Butsch, CALL HER OFFICE AT [phone].  SEEK IMMEDIATE MEDICAL ATTENTION AND CALL THE OFFICE: [phone] IF:  You have more than a spotting of blood in your stool.   Your belly is swollen (abdominal distention).   You are nauseated or vomiting.   You have a temperature over 101F.   You have abdominal pain or discomfort that is  severe or gets worse throughout the day.  High-Fiber Diet A high-fiber diet changes your normal diet to include more whole grains, legumes, fruits, and vegetables. Changes in the diet involve replacing refined carbohydrates with unrefined foods. The calorie level of the diet is essentially unchanged. The Dietary Reference Intake (recommended amount) for adult males is 38 grams per day. For adult females, it is 25 grams per day. Pregnant and lactating women should consume 28 grams of fiber per day. Fiber is the intact part of a plant that is not broken down during  digestion. Functional fiber is fiber that has been isolated from the plant to provide a beneficial effect in the body. PURPOSE  Increase stool bulk.   Ease and regulate bowel movements.   Lower cholesterol.   REDUCE RISK OF COLON CANCER  INDICATIONS THAT YOU NEED MORE FIBER  Constipation and hemorrhoids.   Uncomplicated diverticulosis (intestine condition) and irritable bowel syndrome.   Weight management.   As a protective measure against hardening of the arteries (atherosclerosis), diabetes, and cancer.   GUIDELINES FOR INCREASING FIBER IN THE DIET  Start adding fiber to the diet slowly. A gradual increase of about 5 more grams (2 slices of whole-wheat bread, 2 servings of most fruits or vegetables, or 1 bowl of high-fiber cereal) per day is best. Too rapid an increase in fiber may result in constipation, flatulence, and bloating.   Drink enough water and fluids to keep your urine clear or pale yellow. Water, juice, or caffeine-free drinks are recommended. Not drinking enough fluid may cause constipation.   Eat a variety of high-fiber foods rather than one type of fiber.   Try to increase your intake of fiber through using high-fiber foods rather than fiber pills or supplements that contain small amounts of fiber.   The goal is to change the types of food eaten. Do not supplement your present diet with high-fiber foods, but replace foods in your present diet.   INCLUDE A VARIETY OF FIBER SOURCES  Replace refined and processed grains with whole grains, canned fruits with fresh fruits, and incorporate other fiber sources. White rice, white breads, and most bakery goods contain little or no fiber.   Brown whole-grain rice, buckwheat oats, and many fruits and vegetables are all good sources of fiber. These include: broccoli, Brussels sprouts, cabbage, cauliflower, beets, sweet potatoes, white potatoes (skin on), carrots, tomatoes, eggplant, squash, berries, fresh fruits, and dried  fruits.   Cereals appear to be the richest source of fiber. Cereal fiber is found in whole grains and bran. Bran is the fiber-rich outer coat of cereal grain, which is largely removed in refining. In whole-grain cereals, the bran remains. In breakfast cereals, the largest amount of fiber is found in those with "bran" in their names. The fiber content is sometimes indicated on the label.   You may need to include additional fruits and vegetables each day.   In baking, for 1 cup white flour, you may use the following substitutions:   1 cup whole-wheat flour minus 2 tablespoons.   1/2 cup white flour plus 1/2 cup whole-wheat flour.   Polyps, Colon  A polyp is extra tissue that grows inside your body. Colon polyps grow in the large intestine. The large intestine, also called the colon, is part of your digestive system. It is a long, hollow tube at the end of your digestive tract where your body makes and stores stool. Most polyps are not dangerous. They are benign. This means they are  not cancerous. But over time, some types of polyps can turn into cancer. Polyps that are smaller than a pea are usually not harmful. But larger polyps could someday become or may already be cancerous. To be safe, doctors remove all polyps and test them.   WHO GETS POLYPS? Anyone can get polyps, but certain people are more likely than others. You may have a greater chance of getting polyps if:  You are over 50.   You have had polyps before.   Someone in your family has had polyps.   Someone in your family has had cancer of the large intestine.   Find out if someone in your family has had polyps. You may also be more likely to get polyps if you:   Eat a lot of fatty foods   Smoke   Drink alcohol   Do not exercise  Eat too much     PREVENTION There is not one sure way to prevent polyps. You might be able to lower your risk of getting them if you:  Eat more fruits and vegetables and less fatty food.    Do not smoke.   Avoid alcohol.   Exercise every day.   Lose weight if you are overweight.   Eating more calcium and folate can also lower your risk of getting polyps. Some foods that are rich in calcium are milk, cheese, and broccoli. Some foods that are rich in folate are chickpeas, kidney beans, and spinach.     Hemorrhoids Hemorrhoids are dilated (enlarged) veins around the rectum. Sometimes clots will form in the veins. This makes them swollen and painful. These are called thrombosed hemorrhoids. Causes of hemorrhoids include:  Constipation.   Straining to have a bowel movement.   HEAVY LIFTING  HOME CARE INSTRUCTIONS  Eat a well balanced diet and drink 6 to 8 glasses of water every day to avoid constipation. You may also use a bulk laxative.   Avoid straining to have bowel movements.   Keep anal area dry and clean.   Do not use a donut shaped pillow or sit on the toilet for long periods. This increases blood pooling and pain.   Move your bowels when your body has the urge; this will require less straining and will decrease pain and pressure.

## 2017-08-15 ENCOUNTER — Encounter (HOSPITAL_COMMUNITY): Payer: Self-pay | Admitting: Gastroenterology

## 2017-08-16 ENCOUNTER — Telehealth: Payer: Self-pay | Admitting: Gastroenterology

## 2017-08-16 NOTE — Telephone Encounter (Signed)
Pt is aware.  

## 2017-08-16 NOTE — Telephone Encounter (Signed)
Tried to call. Could not reach pt.

## 2017-08-16 NOTE — Telephone Encounter (Signed)
Please call pt. She had ONE HYPERPLASTIC POLYP AND ONE BENIGN polypoid lesion removed,.  DRINK WATER EAT FIBER. CONTINUE YOUR WEIGHT LOSS EFFORTS. NEXT COLONOSCOPY IN 10 YEARS.

## 2017-08-16 NOTE — Telephone Encounter (Signed)
Reminder in epic °

## 2017-12-18 ENCOUNTER — Other Ambulatory Visit (HOSPITAL_COMMUNITY): Payer: Self-pay | Admitting: Family Medicine

## 2017-12-18 DIAGNOSIS — Z1231 Encounter for screening mammogram for malignant neoplasm of breast: Secondary | ICD-10-CM

## 2018-01-28 ENCOUNTER — Ambulatory Visit (HOSPITAL_COMMUNITY)
Admission: RE | Admit: 2018-01-28 | Discharge: 2018-01-28 | Disposition: A | Discharge status: Home / Self Care | Payer: Self-pay | Source: Ambulatory Visit | Attending: Family Medicine | Admitting: Family Medicine

## 2018-01-28 ENCOUNTER — Ambulatory Visit (HOSPITAL_COMMUNITY): Payer: PRIVATE HEALTH INSURANCE

## 2018-01-28 DIAGNOSIS — Z1231 Encounter for screening mammogram for malignant neoplasm of breast: Secondary | ICD-10-CM | POA: Insufficient documentation

## 2018-12-25 ENCOUNTER — Other Ambulatory Visit (HOSPITAL_COMMUNITY): Payer: Self-pay | Admitting: Family Medicine

## 2018-12-25 DIAGNOSIS — Z1231 Encounter for screening mammogram for malignant neoplasm of breast: Secondary | ICD-10-CM

## 2019-02-03 ENCOUNTER — Ambulatory Visit (HOSPITAL_COMMUNITY)
Admission: RE | Admit: 2019-02-03 | Discharge: 2019-02-03 | Disposition: A | Discharge status: Home / Self Care | Payer: Managed Care, Other (non HMO) | Source: Ambulatory Visit | Attending: Family Medicine | Admitting: Family Medicine

## 2019-02-03 ENCOUNTER — Other Ambulatory Visit: Payer: Self-pay

## 2019-02-03 DIAGNOSIS — Z1231 Encounter for screening mammogram for malignant neoplasm of breast: Secondary | ICD-10-CM | POA: Diagnosis present

## 2019-12-31 ENCOUNTER — Other Ambulatory Visit (HOSPITAL_COMMUNITY): Payer: Self-pay | Admitting: Family Medicine

## 2019-12-31 DIAGNOSIS — Z1231 Encounter for screening mammogram for malignant neoplasm of breast: Secondary | ICD-10-CM

## 2020-02-09 ENCOUNTER — Ambulatory Visit (HOSPITAL_COMMUNITY)
Admission: RE | Admit: 2020-02-09 | Discharge: 2020-02-09 | Disposition: A | Discharge status: Home / Self Care | Payer: Managed Care, Other (non HMO) | Source: Ambulatory Visit | Attending: Family Medicine | Admitting: Family Medicine

## 2020-02-09 ENCOUNTER — Other Ambulatory Visit: Payer: Self-pay

## 2020-02-09 DIAGNOSIS — Z1231 Encounter for screening mammogram for malignant neoplasm of breast: Secondary | ICD-10-CM | POA: Diagnosis not present

## 2021-02-21 ENCOUNTER — Other Ambulatory Visit (HOSPITAL_COMMUNITY): Payer: Self-pay | Admitting: Family Medicine

## 2021-02-21 DIAGNOSIS — Z1231 Encounter for screening mammogram for malignant neoplasm of breast: Secondary | ICD-10-CM

## 2021-02-28 ENCOUNTER — Other Ambulatory Visit: Payer: Self-pay

## 2021-02-28 ENCOUNTER — Ambulatory Visit (HOSPITAL_COMMUNITY)
Admission: RE | Admit: 2021-02-28 | Discharge: 2021-02-28 | Disposition: A | Discharge status: Home / Self Care | Payer: Managed Care, Other (non HMO) | Source: Ambulatory Visit | Attending: Family Medicine | Admitting: Family Medicine

## 2021-02-28 DIAGNOSIS — Z1231 Encounter for screening mammogram for malignant neoplasm of breast: Secondary | ICD-10-CM

## 2022-01-26 ENCOUNTER — Other Ambulatory Visit (HOSPITAL_COMMUNITY): Payer: Self-pay | Admitting: Family Medicine

## 2022-01-26 DIAGNOSIS — Z1231 Encounter for screening mammogram for malignant neoplasm of breast: Secondary | ICD-10-CM

## 2022-03-06 ENCOUNTER — Ambulatory Visit (HOSPITAL_COMMUNITY)
Admission: RE | Admit: 2022-03-06 | Discharge: 2022-03-06 | Disposition: A | Discharge status: Home / Self Care | Payer: Managed Care, Other (non HMO) | Source: Ambulatory Visit | Attending: Family Medicine | Admitting: Family Medicine

## 2022-03-06 DIAGNOSIS — Z1231 Encounter for screening mammogram for malignant neoplasm of breast: Secondary | ICD-10-CM | POA: Diagnosis present

## 2022-07-17 IMAGING — MG MM DIGITAL SCREENING BILAT W/ TOMO AND CAD
8 of 14 series · 8 of 40 positions shown · non-contrast
Comparison: Previous exam(s).

CLINICAL DATA: Screening.

EXAM:
DIGITAL SCREENING BILATERAL MAMMOGRAM WITH TOMOSYNTHESIS AND CAD
TECHNIQUE: Bilateral screening digital craniocaudal and mediolateral oblique
mammograms were obtained. Bilateral screening digital breast
tomosynthesis was performed. The images were evaluated with
computer-aided detection.

[L MLO synth-2D (1 of 2)]
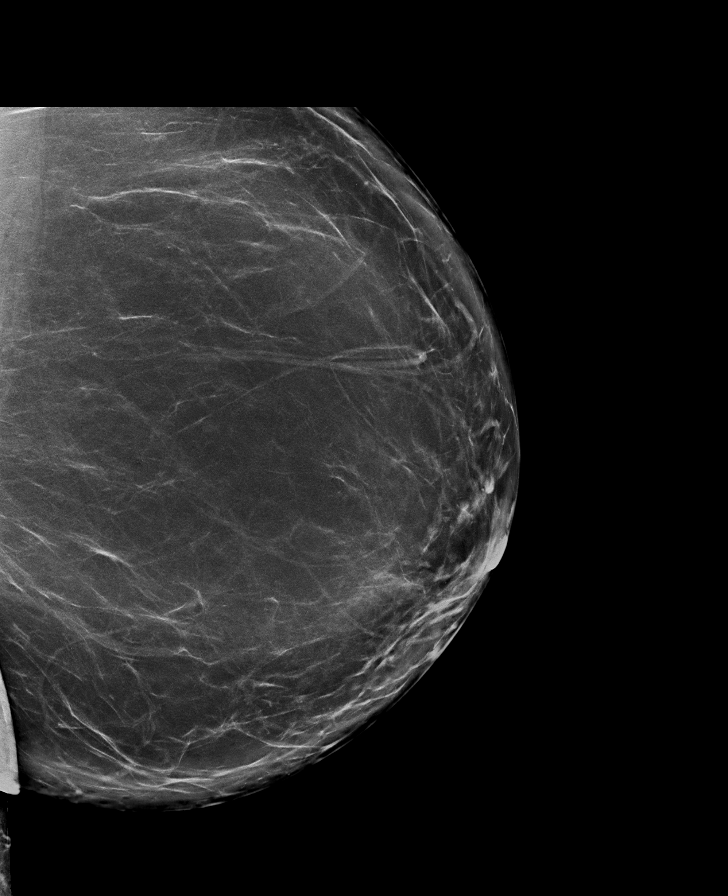

[R MLO synth-2D (1 of 2)]
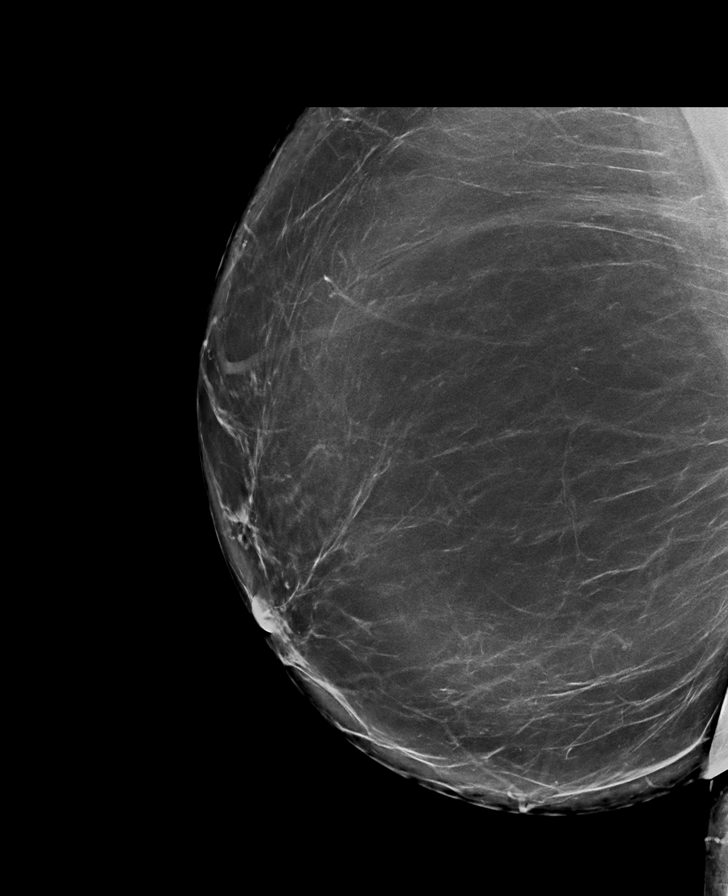

[L CC synth-2D (1 of 2)]
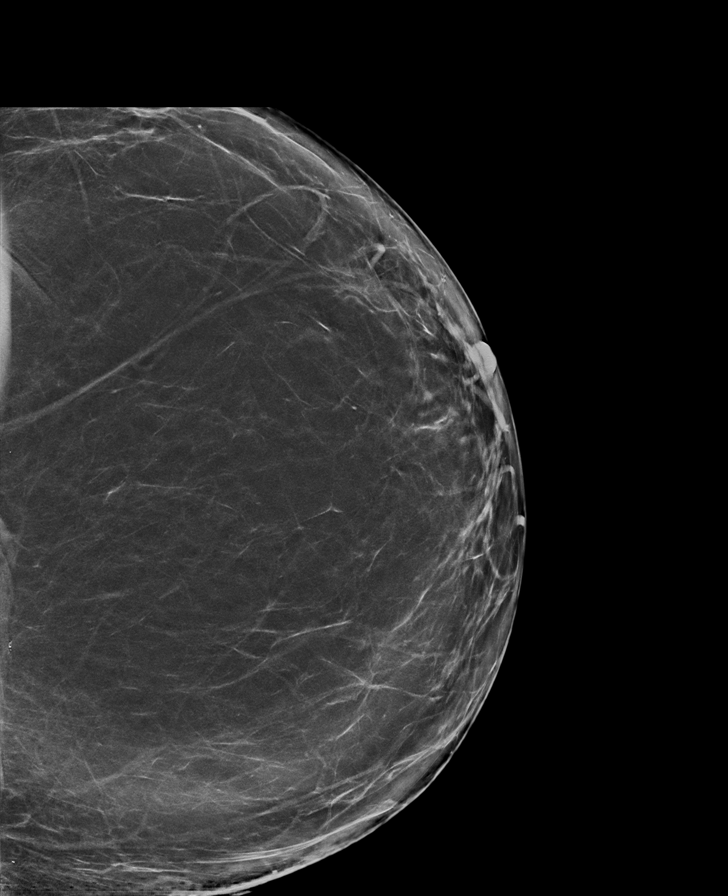

[R MLO synth-2D (2 of 2)]
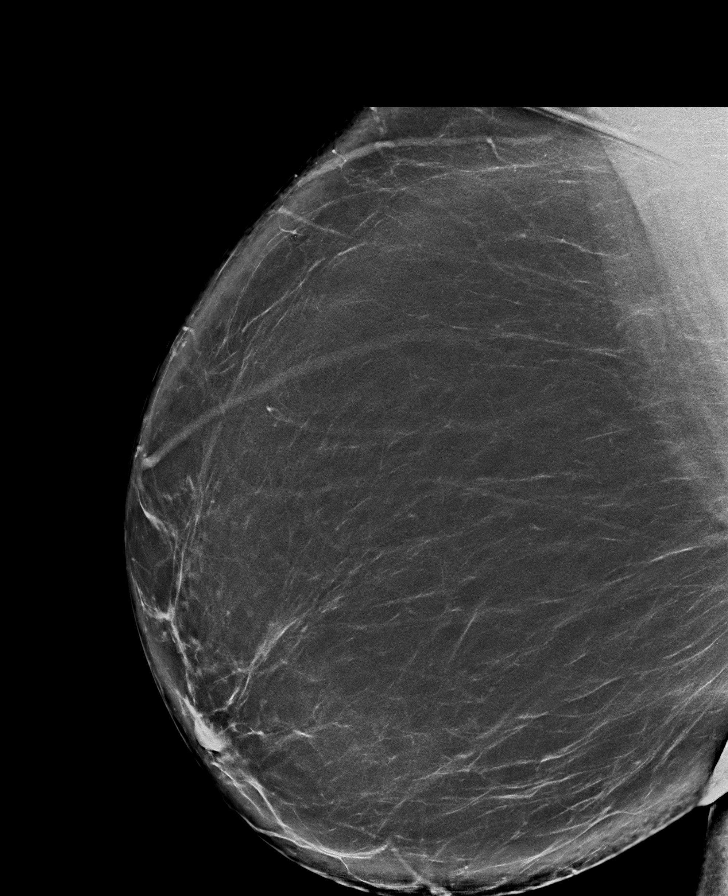

[L CC synth-2D (2 of 2)]
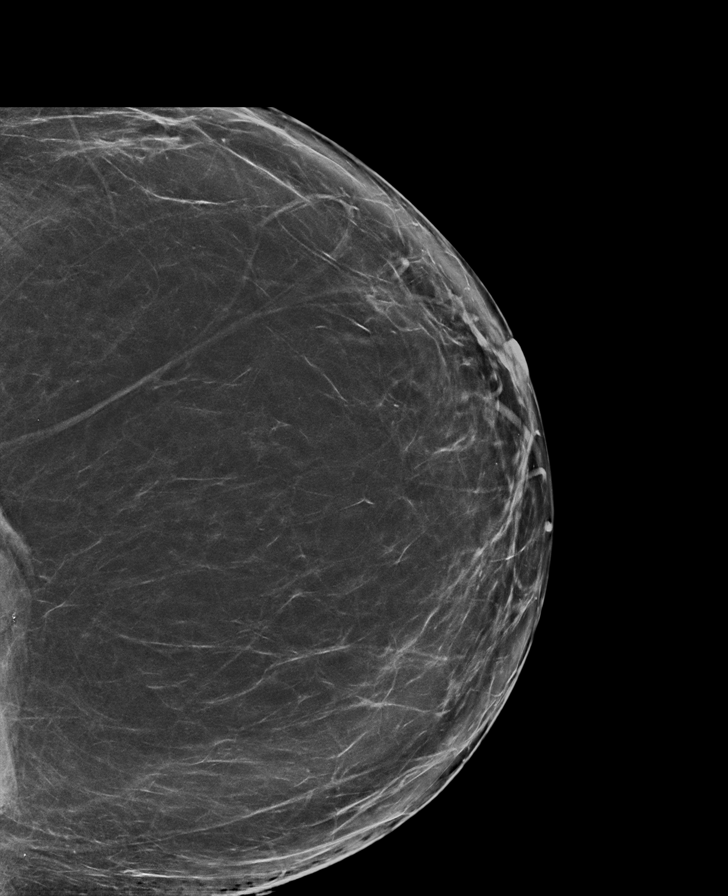

[L MLO synth-2D (2 of 2)]
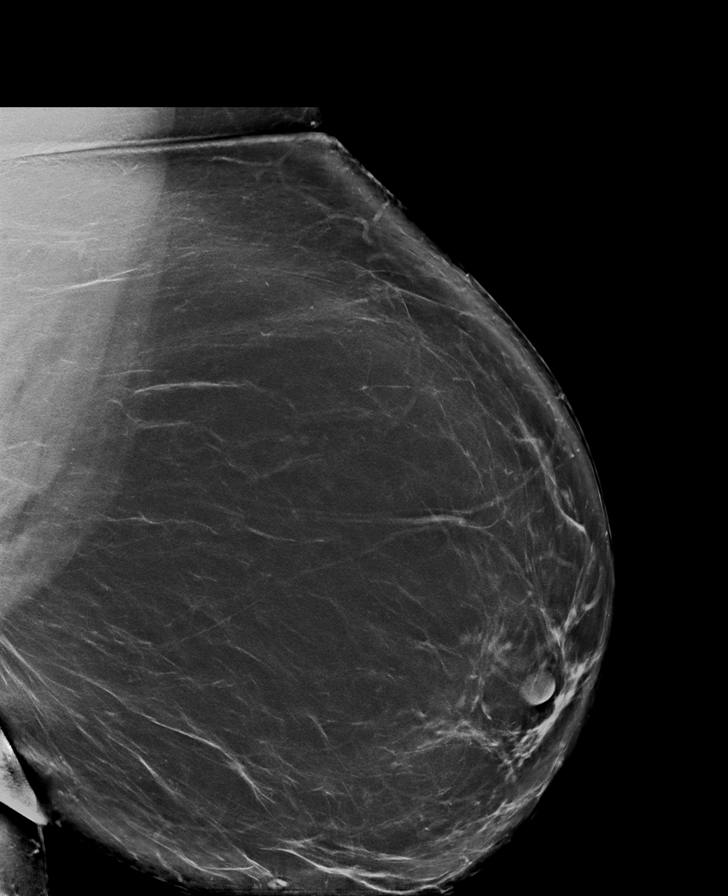

[R CC synth-2D]
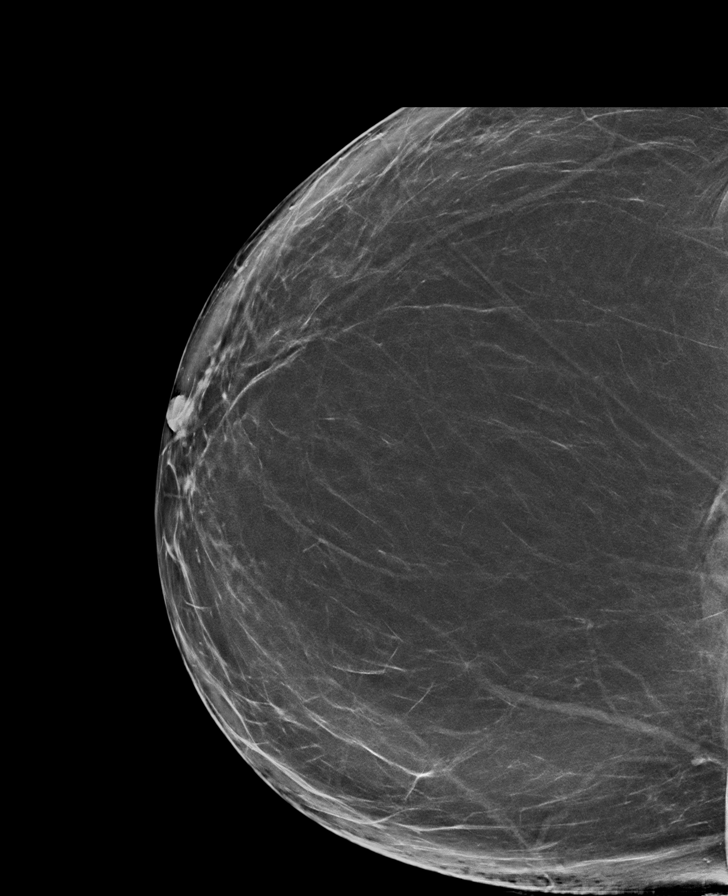

[R MLO tomo · tomo slice 57/112.0]
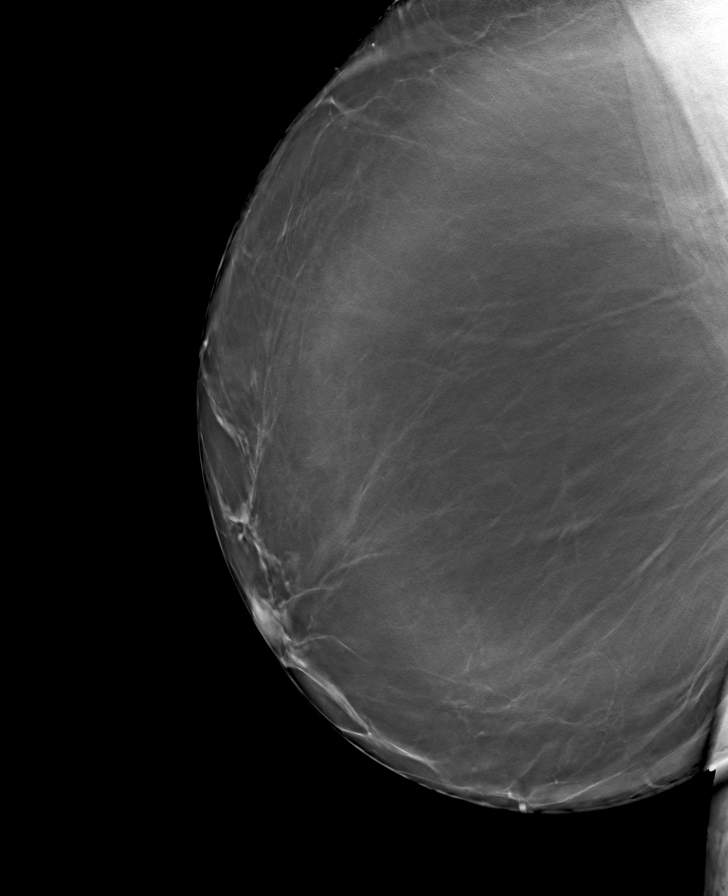

[8 of 40 positions shown; findings below may reference images not displayed]

ACR Breast Density Category b: There are scattered areas of
fibroglandular density.
FINDINGS: There are no findings suspicious for malignancy. The images were
evaluated with computer-aided detection.
IMPRESSION: No mammographic evidence of malignancy. A result letter of this
screening mammogram will be mailed directly to the patient.

RECOMMENDATION:
Screening mammogram in one year. (Code:[id number])

BI-RADS CATEGORY  1: Negative.

## 2022-09-19 ENCOUNTER — Encounter: Payer: Self-pay | Admitting: *Deleted

## 2022-09-26 ENCOUNTER — Telehealth: Payer: Self-pay | Admitting: Internal Medicine

## 2022-09-26 NOTE — Telephone Encounter (Signed)
Patient said she received letter to repeat colonoscopy in 5 years which would be 11/23.  She said she was told 10 years and wants to know why it is different in the chart?  I told her that I had no explanation and she wanted someone to call her back that could explain this.

## 2022-09-27 NOTE — Telephone Encounter (Signed)
Phoned and spoke with the Samantha Walsh and advised her that Dr. Oneida Alar note did say 10 year colonoscopy, but it was NIC'ed for 5 to 10 years. The Samantha Walsh states she isn't having any issues so therefore declines colonoscopy now

## 2023-02-07 ENCOUNTER — Other Ambulatory Visit (HOSPITAL_COMMUNITY): Payer: Self-pay | Admitting: Family Medicine

## 2023-02-07 DIAGNOSIS — Z1231 Encounter for screening mammogram for malignant neoplasm of breast: Secondary | ICD-10-CM

## 2023-02-16 ENCOUNTER — Ambulatory Visit: Payer: Managed Care, Other (non HMO) | Admitting: Internal Medicine

## 2023-03-12 ENCOUNTER — Ambulatory Visit (HOSPITAL_COMMUNITY)
Admission: RE | Admit: 2023-03-12 | Discharge: 2023-03-12 | Disposition: A | Discharge status: Home / Self Care | Payer: 59 | Source: Ambulatory Visit | Attending: Family Medicine | Admitting: Family Medicine

## 2023-03-12 DIAGNOSIS — Z1231 Encounter for screening mammogram for malignant neoplasm of breast: Secondary | ICD-10-CM | POA: Diagnosis present

## 2024-02-25 ENCOUNTER — Other Ambulatory Visit (HOSPITAL_COMMUNITY): Payer: Self-pay | Admitting: Family Medicine

## 2024-02-25 DIAGNOSIS — Z1231 Encounter for screening mammogram for malignant neoplasm of breast: Secondary | ICD-10-CM

## 2024-03-24 ENCOUNTER — Ambulatory Visit (HOSPITAL_COMMUNITY)
Admission: RE | Admit: 2024-03-24 | Discharge: 2024-03-24 | Disposition: A | Discharge status: Home / Self Care | Source: Ambulatory Visit | Attending: Family Medicine | Admitting: Family Medicine

## 2024-03-24 DIAGNOSIS — Z1231 Encounter for screening mammogram for malignant neoplasm of breast: Secondary | ICD-10-CM | POA: Diagnosis present

## 2024-12-24 ENCOUNTER — Ambulatory Visit: Admitting: Physician Assistant

## 2024-12-24 ENCOUNTER — Encounter: Payer: Self-pay | Admitting: Physician Assistant

## 2024-12-24 VITALS — BP 128/78 | HR 64 | Temp 97.7°F | Ht 60.0 in | Wt 273.0 lb

## 2024-12-24 DIAGNOSIS — I10 Essential (primary) hypertension: Secondary | ICD-10-CM | POA: Insufficient documentation

## 2024-12-24 DIAGNOSIS — R052 Subacute cough: Secondary | ICD-10-CM | POA: Diagnosis not present

## 2024-12-24 MED ORDER — HYDROCHLOROTHIAZIDE 25 MG PO TABS: 25.0000 mg | ORAL_TABLET | Freq: Every day | ORAL | 1 refills | Status: AC

## 2024-12-24 MED ORDER — NORETHINDRONE 0.35 MG PO TABS: 1.0000 | ORAL_TABLET | Freq: Every day | ORAL | 11 refills | Status: AC

## 2024-12-24 NOTE — Progress Notes (Signed)
 "  New Patient Office Visit  Subjective    Patient ID: Samantha Walsh, female    DOB: 1965/11/28  Age: 59 y.o. MRN: [id number]  CC:  Chief Complaint  Patient presents with   New Patient (Initial Visit)    Cough since December, since in ER twice, first told it was the flu, second time told it was an URI and prescribed Benonatate which patient has been taking but has not helped.    HPI [name] presents to establish care  Discussed the use of AI scribe software for clinical note transcription with the patient, who gave verbal consent to proceed.  History of Present Illness Samantha Walsh is a 59 year old female who presents with a persistent cough.  She experiences a persistent cough that 'comes and goes,' particularly exacerbated by talking at work, where she is exposed to dry air and low humidity. Initially, she attributed the onset of her cough to drinking strong coffee and noted feeling unwell the day before. A chest x-ray performed during an emergency room visit returned normal results. She has been taking Zyrtec daily since and she previously used Coricidin for cough and cold, which she found helpful. The cough is described as 'dry and tickly,' with occasional shortness of breath when talking. She uses cough drops and drinks warm tea with lemon to alleviate symptoms.  Her past medical history includes cervical polyps, hypertension, acid reflux, and a hiatal hernia. She receives annual pelvic exams to monitor for recurrence of cervical polyps and recalls being referred to an OBGYN some years ago.  Her current medications include Zyrtec , Coricidin as needed for cough and cold, hydrochlorothiazide  for blood pressure, Prilosec for acid reflux, and birth control pills. She also has hyoscyamine for a hiatal hernia, which she keeps on hand for potential symptoms.  She maintains an active lifestyle, working out regularly at [employer] to manage her weight. She is up to date  with her mammogram and had a colonoscopy at age 57 or 7, with instructions to return in ten years. She works at a courthouse and exercises regularly. She is due for her annual physical.     Outpatient Encounter Medications as of 12/24/2024  Medication Sig   cetirizine (ZYRTEC) 10 MG tablet Take 10 mg by mouth daily as needed for allergies. Takes only as needed   hyoscyamine (ANASPAZ) 0.125 MG TBDP disintergrating tablet Place 0.125 mg under the tongue 4 (four) times daily as needed for cramping.    ibuprofen (ADVIL,MOTRIN) 200 MG tablet Take 400 mg by mouth every 8 (eight) hours as needed for mild pain or moderate pain.   omeprazole (PRILOSEC OTC) 20 MG tablet Take 20 mg by mouth daily.   [DISCONTINUED] hydrochlorothiazide  (HYDRODIURIL ) 25 MG tablet Take 25 mg by mouth daily.   [DISCONTINUED] norethindrone  (MICRONOR ,CAMILA ,ERRIN ) 0.35 MG tablet Take 1 tablet by mouth daily.   hydrochlorothiazide  (HYDRODIURIL ) 25 MG tablet Take 1 tablet (25 mg total) by mouth daily.   norethindrone  (MICRONOR ) 0.35 MG tablet Take 1 tablet (0.35 mg total) by mouth daily.   No facility-administered encounter medications on file as of 12/24/2024.    Past Medical History:  Diagnosis Date   GERD (gastroesophageal reflux disease)    Hiatal hernia     Past Surgical History:  Procedure Laterality Date   CHOLECYSTECTOMY     COLONOSCOPY N/A 08/13/2017   Procedure: COLONOSCOPY;  Surgeon: Harvey Margo CROME, MD;  Location: [facility];  Service: Endoscopy;  Laterality: N/A;  10:30 Am   POLYPECTOMY  08/13/2017   Procedure: POLYPECTOMY;  Surgeon: Harvey Margo CROME, MD;  Location: [facility];  Service: Endoscopy;;  colon    Family History  Problem Relation Age of Onset   Diabetes Mother    Hypertension Mother    Colon cancer Neg Hx    Colon polyps Neg Hx     Social History   Socioeconomic History   Marital status: Single    Spouse name: Not on file   Number of children: Not on file   Years of  education: Not on file   Highest education level: Not on file  Occupational History   Not on file  Tobacco Use   Smoking status: Never   Smokeless tobacco: Never  Substance and Sexual Activity   Alcohol use: No   Drug use: No   Sexual activity: Not Currently    Birth control/protection: Pill  Other Topics Concern   Not on file  Social History Narrative   Not on file   Social Drivers of Health   Tobacco Use: Low Risk (12/24/2024)   Patient History    Smoking Tobacco Use: Never    Smokeless Tobacco Use: Never    Passive Exposure: Not on file  Financial Resource Strain: Not on file  Food Insecurity: Not on file  Transportation Needs: Not on file  Physical Activity: Not on file  Stress: Not on file  Social Connections: Not on file  Intimate Partner Violence: Not on file  Depression (PHQ2-9): Low Risk (12/24/2024)   Depression (PHQ2-9)    PHQ-2 Score: 0  Alcohol Screen: Not on file  Housing: Not on file  Utilities: Not on file  Health Literacy: Not on file    Review of Systems  Constitutional:  Negative for activity change, appetite change, fatigue and fever.  Respiratory:  Positive for cough. Negative for shortness of breath.   Cardiovascular:  Negative for chest pain.  Neurological:  Negative for light-headedness and headaches.         Objective    BP 128/78   Pulse 64   Temp 97.7 F (36.5 C)   Ht 5' (1.524 m)   Wt 273 lb (123.8 kg)   SpO2 97%   BMI 53.32 kg/m   Physical Exam Constitutional:      General: She is not in acute distress.    Appearance: Normal appearance. She is obese. She is not ill-appearing.  HENT:     Head: Normocephalic and atraumatic.     Mouth/Throat:     Mouth: Mucous membranes are moist.     Pharynx: Oropharynx is clear.  Eyes:     Extraocular Movements: Extraocular movements intact.     Conjunctiva/sclera: Conjunctivae normal.  Cardiovascular:     Rate and Rhythm: Normal rate and regular rhythm.     Heart sounds: Normal  heart sounds. No murmur heard. Pulmonary:     Effort: Pulmonary effort is normal.     Breath sounds: No wheezing, rhonchi or rales.  Skin:    General: Skin is warm and dry.  Neurological:     General: No focal deficit present.     Mental Status: She is alert and oriented to person, place, and time.  Psychiatric:        Mood and Affect: Mood normal.        Behavior: Behavior normal.        Assessment & Plan:  Subacute cough Assessment & Plan: Intermittent dry, tickly cough exacerbated by talking  and dry air. No signs of respiratory infection or bronchitis. Lungs clear to auscultation bilaterally on exam today. Likely related to dry air and low humidity. Discussed post viral cough syndrome. Previous chest x-ray was normal. Tessalon pearls were ineffective. - Continue Zyrtec daily for allergy management. - Restart Coricidin as needed for cough and cold symptoms. - Use warm tea with lemon and honey as a natural cough suppressant. - Consider using cough drops or a humidifier to alleviate dry throat. - Return for evaluation if cough persists in the next couple of weeks.   Primary hypertension Assessment & Plan: 128/78 Controlled. Continue current medications. No change in management. Medication refilled.  Discussed DASH diet and dietary sodium restrictions.  Continue dietary efforts and physical activity.   Orders: -     hydroCHLOROthiazide ; Take 1 tablet (25 mg total) by mouth daily.  Dispense: 90 tablet; Refill: 1  Other orders -     Norethindrone ; Take 1 tablet (0.35 mg total) by mouth daily.  Dispense: 28 tablet; Refill: 11    Return in about 3 months (around 03/23/2025) for phys (no Pap).   Malikai Gut, PA-C  "

## 2024-12-24 NOTE — Assessment & Plan Note (Signed)
 128/78 Controlled. Continue current medications. No change in management. Medication refilled.  Discussed DASH diet and dietary sodium restrictions.  Continue dietary efforts and physical activity.

## 2024-12-24 NOTE — Assessment & Plan Note (Signed)
 Intermittent dry, tickly cough exacerbated by talking and dry air. No signs of respiratory infection or bronchitis. Lungs clear to auscultation bilaterally on exam today. Likely related to dry air and low humidity. Discussed post viral cough syndrome. Previous chest x-ray was normal. Tessalon pearls were ineffective. - Continue Zyrtec daily for allergy management. - Restart Coricidin as needed for cough and cold symptoms. - Use warm tea with lemon and honey as a natural cough suppressant. - Consider using cough drops or a humidifier to alleviate dry throat. - Return for evaluation if cough persists in the next couple of weeks.

## 2025-03-25 ENCOUNTER — Encounter: Admitting: Physician Assistant
